# Patient Record
Sex: Female | Born: 1975
Health system: Southern US, Community
[De-identification: ages and names within clinical notes are randomized; demographics above are authoritative.]

## PROBLEM LIST (undated history)

## (undated) DIAGNOSIS — D649 Anemia, unspecified: Secondary | ICD-10-CM

## (undated) DIAGNOSIS — Z8669 Personal history of other diseases of the nervous system and sense organs: Secondary | ICD-10-CM

## (undated) DIAGNOSIS — Z803 Family history of malignant neoplasm of breast: Secondary | ICD-10-CM

## (undated) DIAGNOSIS — G8929 Other chronic pain: Secondary | ICD-10-CM

## (undated) DIAGNOSIS — K5190 Ulcerative colitis, unspecified, without complications: Secondary | ICD-10-CM

## (undated) DIAGNOSIS — E538 Deficiency of other specified B group vitamins: Secondary | ICD-10-CM

## (undated) DIAGNOSIS — R0789 Other chest pain: Secondary | ICD-10-CM

## (undated) DIAGNOSIS — K219 Gastro-esophageal reflux disease without esophagitis: Secondary | ICD-10-CM

## (undated) DIAGNOSIS — Z800 Family history of malignant neoplasm of digestive organs: Secondary | ICD-10-CM

## (undated) DIAGNOSIS — Z8481 Family history of carrier of genetic disease: Secondary | ICD-10-CM

## (undated) DIAGNOSIS — D72819 Decreased white blood cell count, unspecified: Secondary | ICD-10-CM

## (undated) HISTORY — DX: Other chest pain: R07.89

## (undated) HISTORY — PX: KNEE ARTHROSCOPY: SUR90

## (undated) HISTORY — DX: Other chronic pain: G89.29

## (undated) HISTORY — DX: Family history of malignant neoplasm of digestive organs: Z80.0

## (undated) HISTORY — DX: Deficiency of other specified B group vitamins: E53.8

## (undated) HISTORY — DX: Family history of malignant neoplasm of breast: Z80.3

## (undated) HISTORY — DX: Gastro-esophageal reflux disease without esophagitis: K21.9

## (undated) HISTORY — DX: Decreased white blood cell count, unspecified: D72.819

## (undated) HISTORY — PX: MYOMECTOMY: SHX85

## (undated) HISTORY — DX: Family history of carrier of genetic disease: Z84.81

---

## 2001-05-23 ENCOUNTER — Emergency Department (HOSPITAL_COMMUNITY): Admission: EM | Admit: 2001-05-23 | Discharge: 2001-05-23 | Payer: Self-pay | Admitting: Emergency Medicine

## 2001-07-20 ENCOUNTER — Emergency Department (HOSPITAL_COMMUNITY): Admission: EM | Admit: 2001-07-20 | Discharge: 2001-07-20 | Payer: Self-pay | Admitting: Emergency Medicine

## 2001-07-20 ENCOUNTER — Encounter: Payer: Self-pay | Admitting: Emergency Medicine

## 2002-08-24 ENCOUNTER — Emergency Department (HOSPITAL_COMMUNITY): Admission: EM | Admit: 2002-08-24 | Discharge: 2002-08-24 | Payer: Self-pay | Admitting: Emergency Medicine

## 2003-06-07 ENCOUNTER — Emergency Department (HOSPITAL_COMMUNITY): Admission: EM | Admit: 2003-06-07 | Discharge: 2003-06-07 | Payer: Self-pay | Admitting: Emergency Medicine

## 2004-11-09 ENCOUNTER — Emergency Department (HOSPITAL_COMMUNITY): Admission: EM | Admit: 2004-11-09 | Discharge: 2004-11-09 | Payer: Self-pay | Admitting: Emergency Medicine

## 2004-11-22 ENCOUNTER — Emergency Department (HOSPITAL_COMMUNITY): Admission: EM | Admit: 2004-11-22 | Discharge: 2004-11-23 | Payer: Self-pay | Admitting: Emergency Medicine

## 2007-03-10 ENCOUNTER — Emergency Department (HOSPITAL_COMMUNITY): Admission: EM | Admit: 2007-03-10 | Discharge: 2007-03-10 | Payer: Self-pay | Admitting: Emergency Medicine

## 2007-10-18 ENCOUNTER — Emergency Department (HOSPITAL_COMMUNITY): Admission: EM | Admit: 2007-10-18 | Discharge: 2007-10-18 | Payer: Self-pay | Admitting: Family Medicine

## 2008-09-11 ENCOUNTER — Emergency Department (HOSPITAL_COMMUNITY): Admission: EM | Admit: 2008-09-11 | Discharge: 2008-09-11 | Payer: Self-pay | Admitting: Emergency Medicine

## 2008-09-15 ENCOUNTER — Emergency Department (HOSPITAL_COMMUNITY): Admission: EM | Admit: 2008-09-15 | Discharge: 2008-09-15 | Payer: Self-pay | Admitting: Emergency Medicine

## 2008-09-26 ENCOUNTER — Emergency Department (HOSPITAL_COMMUNITY): Admission: EM | Admit: 2008-09-26 | Discharge: 2008-09-26 | Payer: Self-pay | Admitting: Emergency Medicine

## 2008-10-20 ENCOUNTER — Encounter: Admission: RE | Admit: 2008-10-20 | Discharge: 2008-10-20 | Payer: Self-pay | Admitting: Internal Medicine

## 2009-07-04 ENCOUNTER — Encounter: Admission: RE | Admit: 2009-07-04 | Discharge: 2009-07-04 | Payer: Self-pay | Admitting: Internal Medicine

## 2009-07-04 ENCOUNTER — Encounter (INDEPENDENT_AMBULATORY_CARE_PROVIDER_SITE_OTHER): Payer: Self-pay | Admitting: Radiology

## 2009-12-13 ENCOUNTER — Emergency Department (HOSPITAL_COMMUNITY): Admission: EM | Admit: 2009-12-13 | Discharge: 2009-12-14 | Payer: Self-pay | Admitting: Emergency Medicine

## 2010-08-04 ENCOUNTER — Emergency Department (HOSPITAL_COMMUNITY): Admission: EM | Admit: 2010-08-04 | Discharge: 2010-08-04 | Payer: Self-pay | Admitting: Emergency Medicine

## 2010-10-20 ENCOUNTER — Encounter: Payer: Self-pay | Admitting: Internal Medicine

## 2011-11-23 ENCOUNTER — Emergency Department (HOSPITAL_COMMUNITY)
Admission: EM | Admit: 2011-11-23 | Discharge: 2011-11-24 | Disposition: A | Discharge status: Home / Self Care | Payer: BC Managed Care – PPO | Attending: Emergency Medicine | Admitting: Emergency Medicine

## 2011-11-23 ENCOUNTER — Encounter (HOSPITAL_COMMUNITY): Payer: Self-pay | Admitting: *Deleted

## 2011-11-23 DIAGNOSIS — R609 Edema, unspecified: Secondary | ICD-10-CM | POA: Insufficient documentation

## 2011-11-23 DIAGNOSIS — S8012XA Contusion of left lower leg, initial encounter: Secondary | ICD-10-CM

## 2011-11-23 DIAGNOSIS — S8010XA Contusion of unspecified lower leg, initial encounter: Secondary | ICD-10-CM | POA: Insufficient documentation

## 2011-11-23 DIAGNOSIS — IMO0002 Reserved for concepts with insufficient information to code with codable children: Secondary | ICD-10-CM | POA: Insufficient documentation

## 2011-11-23 DIAGNOSIS — M79609 Pain in unspecified limb: Secondary | ICD-10-CM | POA: Insufficient documentation

## 2011-11-23 DIAGNOSIS — W2203XA Walked into furniture, initial encounter: Secondary | ICD-10-CM | POA: Insufficient documentation

## 2011-11-23 NOTE — ED Notes (Signed)
Pt in c/o abrasion to left lower leg, bleeding controlled, states she hit it on the side of a dresser

## 2011-11-24 MED ORDER — HYDROCODONE-ACETAMINOPHEN 5-325 MG PO TABS: 1.0000 | ORAL_TABLET | ORAL | Status: AC | PRN

## 2011-11-24 MED ORDER — TETANUS-DIPHTH-ACELL PERTUSSIS 5-2.5-18.5 LF-MCG/0.5 IM SUSP
0.5000 mL | Freq: Once | INTRAMUSCULAR | Status: AC
  Administered 2011-11-24: 0.5 mL via INTRAMUSCULAR
  Filled 2011-11-24: qty 0.5

## 2011-11-24 NOTE — ED Notes (Signed)
Pt states she hit L lower extremity on dresser at some point tonight, wound noted, bleeding controlled,

## 2011-11-24 NOTE — ED Provider Notes (Signed)
History     CSN: [id number]  Arrival date & time 11/23/11  2140   First MD Initiated Contact with Patient 11/23/11 2353      Chief Complaint  Patient presents with  . Leg Injury    (Consider location/radiation/quality/duration/timing/severity/associated sxs/prior treatment) HPI Comments: Patient here with left lower leg pain s/p hitting the leg on a dresser - hemostatic - pain with ambulation but is able to put pressure on the leg - tetanus not UTD  Patient is a 36 y.o. female presenting with leg pain. The history is provided by the patient. No language interpreter was used.  Leg Pain  The incident occurred 3 to 5 hours ago. The incident occurred at home. The injury mechanism was a direct blow. The pain is present in the left leg. The quality of the pain is described as burning and sharp. The pain is at a severity of 4/10. The pain is moderate. The pain has been constant since onset. Associated symptoms include inability to bear weight. Pertinent negatives include no numbness, no loss of motion, no muscle weakness, no loss of sensation and no tingling. She reports no foreign bodies present. The symptoms are aggravated by nothing. She has tried nothing for the symptoms.    History reviewed. No pertinent past medical history.  History reviewed. No pertinent past surgical history.  History reviewed. No pertinent family history.  History  Substance Use Topics  . Smoking status: Not on file  . Smokeless tobacco: Not on file  . Alcohol Use: Not on file    OB History    Grav Para Term Preterm Abortions TAB SAB Ect Mult Living                  Review of Systems  Constitutional: Negative for fever and chills.  HENT: Negative for neck pain.   Eyes: Negative for pain.  Respiratory: Negative for chest tightness and shortness of breath.   Cardiovascular: Negative for chest pain.  Gastrointestinal: Negative for nausea, vomiting and abdominal pain.  Genitourinary: Negative for  dysuria.  Skin: Positive for wound.  Neurological: Negative for tingling, numbness and headaches.  All other systems reviewed and are negative.    Allergies  Review of patient's allergies indicates no known allergies.  Home Medications  No current outpatient prescriptions on file.  BP 107/74  Pulse 77  Temp(Src) 98.5 F (36.9 C) (Oral)  Resp 16  SpO2 100%  Physical Exam  Nursing note and vitals reviewed. Constitutional: She is oriented to person, place, and time. She appears well-developed and well-nourished. No distress.  HENT:  Head: Normocephalic and atraumatic.  Right Ear: External ear normal.  Left Ear: External ear normal.  Nose: Nose normal.  Mouth/Throat: Oropharynx is clear and moist. No oropharyngeal exudate.  Eyes: Conjunctivae are normal. Pupils are equal, round, and reactive to light. No scleral icterus.  Neck: Normal range of motion. Neck supple.  Cardiovascular: Normal rate and regular rhythm.  Exam reveals no gallop and no friction rub.   No murmur heard. Pulmonary/Chest: Effort normal and breath sounds normal. She exhibits no tenderness.  Abdominal: Soft. Bowel sounds are normal. She exhibits no distension. There is no tenderness.  Musculoskeletal: Normal range of motion. She exhibits edema and tenderness.       Abrasion noted to left lateral shin  Lymphadenopathy:    She has no cervical adenopathy.  Neurological: She is alert and oriented to person, place, and time. No cranial nerve deficit.  Skin: Skin is warm and  dry. No rash noted. No erythema. No pallor.  Psychiatric: She has a normal mood and affect. Her behavior is normal. Judgment and thought content normal.    ED Course  Procedures (including critical care time)  Labs Reviewed - No data to display No results found.   Contusion to left lateral lower leg    MDM  Patient here with abrasion to left lower leg - able to ambulate - no fever, chills, no evidence of fracture - wound dressed,  tetanus updated.        Idalia Needle [place], [place] 11/24/11 0011  Medical screening examination/treatment/procedure(s) were performed by non-physician practitioner and as supervising physician I was immediately available for consultation/collaboration.  Teressa Lower, MD 11/24/11 [phone]

## 2011-11-24 NOTE — Discharge Instructions (Signed)
Contusion A contusion is a deep bruise. Bruises happen when an injury causes bleeding under the skin. Signs of bruising include pain, puffiness (swelling), and discolored skin. The bruise may turn blue, purple, or yellow. HOME CARE   Rest the injured area until the pain and puffiness are better.   Try to limit use of the injured area as much as possible or as told by your doctor.   Put ice on the injured area.   Put ice in a plastic bag.   Place a towel between your skin and the bag.   Leave the ice on for 15 to 20 minutes, 3 to 4 times a day.   Raise (elevate) the injured area above the level of the heart.   Use an elastic bandage to lessen puffiness and motion.   Only take medicine as told by your doctor.   Eat healthy.   See your doctor for a follow-up visit.  GET HELP RIGHT AWAY IF:   There is more redness, puffiness, or pain.   You have a headache, muscle ache, or you feel dizzy and ill.   You have a fever.   The pain is not controlled with medicine.   The bruise is not getting better.   There is yellowish white fluid (pus) coming from the wound.   You lose feeling (numbness) in the injured area.   The bruised area feels cold.   There are new problems.  MAKE SURE YOU:   Understand these instructions.   Will watch your condition.   Will get help right away if you are not doing well or get worse.  Document Released: 03/03/2008 Document Revised: 05/28/2011 Document Reviewed: 03/03/2008 [facility] Patient Information 2012 [place].Hematoma A hematoma is a collection of blood which is located any place outside a blood vessel. It is caused by an injury to blood vessels beneath an injured area with a leaking of blood into that area. As blood accumulates it is known as a hematoma. This collection of blood causes a blue to dark blue coloration of the tissue surrounding it. The injury usually improves in days to weeks. If it is close to the skin it often produces  a yellowish color of the skin and then disappears completely over a brief period of time. These generally get better without any problems. Hematomas rarely need drainage. HOME CARE INSTRUCTIONS   Apply ice to the injured area for 15 to 20 minutes 3 to 4 times per day for the first 1 or 2 days.   Put the ice in a plastic bag and place a towel between the bag of ice and your skin. Discontinue the ice if it causes pain.   If you have a hematoma and a nearby injury to the skin, you may have some bleeding. If the bleeding is more than just a little bit, apply pressure to the area for at least thirty minutes.   If the injury is on an extremity (arm, leg, hand or foot), elevation of that part may help to decrease pain and swelling. Wrapping with an ace or supportive wrap may also be helpful. If the hematoma is on a lower extremity and is painful, crutches may be helpful for a couple days.   If you have been given a tetanus shot because the skin was broken, your arm may get swollen, red and warm to touch at the shot site. This is a normal response to the medicine in the shot. If you did not receive a  tetanus shot today because you did not recall when your last one was given, make sure to check with your caregiver's office and determine if one is needed. Generally for a "dirty" wound, you should receive a tetanus booster if you have not had one in the last five years. If you have a "clean" wound, you should receive a tetanus booster if you have not had one within the last ten years.   Only take over-the-counter or prescription medicines for pain, discomfort, or fever as directed by your caregiver. Do not use aspirin as it may cause bleeding.  SEEK IMMEDIATE MEDICAL CARE IF:   You have pain not controlled with medication.   You develop increasing pain or swelling in the area of injury.   An oral temperature above 102 F (38.9 C) develops.   You develop any problems that seem worse than the problems that  brought you in.  MAKE SURE YOU:   Understand these instructions.   Will watch your condition.   Will get help right away if you are not doing well or get worse.  Document Released: 04/29/2004 Document Revised: 04/10/2011 Document Reviewed: 05/16/2008 [facility] Patient Information 2012 [place].

## 2012-12-06 ENCOUNTER — Emergency Department (HOSPITAL_COMMUNITY)
Admission: EM | Admit: 2012-12-06 | Discharge: 2012-12-06 | Disposition: A | Discharge status: Home / Self Care | Payer: Self-pay | Attending: Emergency Medicine | Admitting: Emergency Medicine

## 2012-12-06 ENCOUNTER — Encounter (HOSPITAL_COMMUNITY): Payer: Self-pay | Admitting: Emergency Medicine

## 2012-12-06 DIAGNOSIS — F17200 Nicotine dependence, unspecified, uncomplicated: Secondary | ICD-10-CM | POA: Insufficient documentation

## 2012-12-06 DIAGNOSIS — Z862 Personal history of diseases of the blood and blood-forming organs and certain disorders involving the immune mechanism: Secondary | ICD-10-CM | POA: Insufficient documentation

## 2012-12-06 DIAGNOSIS — R112 Nausea with vomiting, unspecified: Secondary | ICD-10-CM | POA: Insufficient documentation

## 2012-12-06 DIAGNOSIS — Z3202 Encounter for pregnancy test, result negative: Secondary | ICD-10-CM | POA: Insufficient documentation

## 2012-12-06 HISTORY — DX: Anemia, unspecified: D64.9

## 2012-12-06 LAB — URINALYSIS, ROUTINE W REFLEX MICROSCOPIC
Glucose, UA: NEGATIVE mg/dL
Ketones, ur: NEGATIVE mg/dL
Protein, ur: NEGATIVE mg/dL
Urobilinogen, UA: 1 mg/dL (ref 0.0–1.0)

## 2012-12-06 LAB — URINE MICROSCOPIC-ADD ON

## 2012-12-06 LAB — POCT PREGNANCY, URINE: Preg Test, Ur: NEGATIVE

## 2012-12-06 MED ORDER — SODIUM CHLORIDE 0.9 % IV BOLUS (SEPSIS)
1000.0000 mL | Freq: Once | INTRAVENOUS | Status: AC
  Administered 2012-12-06: 1000 mL via INTRAVENOUS

## 2012-12-06 MED ORDER — PROMETHAZINE HCL 25 MG/ML IJ SOLN
25.0000 mg | Freq: Once | INTRAMUSCULAR | Status: DC
  Filled 2012-12-06: qty 1

## 2012-12-06 MED ORDER — PROMETHAZINE HCL 25 MG PO TABS: 25.0000 mg | ORAL_TABLET | Freq: Four times a day (QID) | ORAL | Status: DC | PRN

## 2012-12-06 MED ORDER — ONDANSETRON 4 MG PO TBDP
4.0000 mg | ORAL_TABLET | Freq: Once | ORAL | Status: AC
  Administered 2012-12-06: 4 mg via ORAL
  Filled 2012-12-06: qty 1

## 2012-12-06 NOTE — ED Provider Notes (Signed)
History     CSN: [id number]  Arrival date & time 12/06/12  0040   First MD Initiated Contact with Patient 12/06/12 [phone]      Chief Complaint  Patient presents with  . Nausea  . Emesis    (Consider location/radiation/quality/duration/timing/severity/associated sxs/prior treatment) HPI Comments: Patient is a 37 year old female who presents with a 3 day history of nausea and vomiting. Patient reports gradual onset and progressive worsening of symptoms since onset. Patient has tried dramamine which provided some relief. Laying down makes her symptoms worse. No alleviating factors. No associated symptoms. No sick contacts.   Patient is a 37 y.o. female presenting with vomiting.  Emesis   Past Medical History  Diagnosis Date  . Anemia     History reviewed. No pertinent past surgical history.  Family History  Problem Relation Age of Onset  . Cancer Other   . Hypertension Other     History  Substance Use Topics  . Smoking status: Current Every Day Smoker    Types: Cigarettes  . Smokeless tobacco: Not on file  . Alcohol Use: Yes     Comment: occ    OB History   Grav Para Term Preterm Abortions TAB SAB Ect Mult Living                  Review of Systems  Gastrointestinal: Positive for nausea and vomiting.  All other systems reviewed and are negative.    Allergies  Percocet  Home Medications   Current Outpatient Rx  Name  Route  Sig  Dispense  Refill  . anti-nausea (EMETROL) solution   Oral   Take 15 mLs by mouth every 15 (fifteen) minutes as needed for nausea.         Marland Kitchen dimenhyDRINATE (DRAMAMINE) 50 MG tablet   Oral   Take 50 mg by mouth every 8 (eight) hours as needed. nausea           BP 107/62  Pulse 63  Temp(Src) 98.2 F (36.8 C) (Oral)  Resp 18  SpO2 98%  LMP 11/21/2012  Physical Exam  Nursing note and vitals reviewed. Constitutional: She is oriented to person, place, and time. She appears well-developed and well-nourished. No distress.   HENT:  Head: Normocephalic and atraumatic.  Eyes: Conjunctivae and EOM are normal. No scleral icterus.  Neck: Normal range of motion. Neck supple.  Cardiovascular: Normal rate and regular rhythm.  Exam reveals no gallop and no friction rub.   No murmur heard. Pulmonary/Chest: Effort normal and breath sounds normal. She has no wheezes. She has no rales. She exhibits no tenderness.  Abdominal: Soft. She exhibits no distension. There is no tenderness. There is no rebound and no guarding.  Musculoskeletal: Normal range of motion.  Neurological: She is alert and oriented to person, place, and time. Coordination normal.  Speech is goal-oriented. Moves limbs without ataxia.   Skin: Skin is warm and dry.  Psychiatric: She has a normal mood and affect. Her behavior is normal.    ED Course  Procedures (including critical care time)  Labs Reviewed  URINALYSIS, ROUTINE W REFLEX MICROSCOPIC - Abnormal; Notable for the following:    APPearance HAZY (*)    Bilirubin Urine SMALL (*)    Leukocytes, UA SMALL (*)    All other components within normal limits  URINE MICROSCOPIC-ADD ON - Abnormal; Notable for the following:    Squamous Epithelial / LPF FEW (*)    All other components within normal limits  POCT  PREGNANCY, URINE   No results found.   1. Nausea and vomiting       MDM  4:08 AM Patient feeling better after ODT zofran. Urinalysis unremarkable for infection. Urine pregnancy negative. Vitals stable and patient afebrile. Patient likely has a viral illness. I will discharge her with zofran. Patient instructed to return with worsening or concerning symptoms.         Alvina Chou, PA-C 12/06/12 [phone]

## 2012-12-06 NOTE — ED Notes (Signed)
Pt states she has had nausea since Friday night  Pt states she has been using dramamine for the nausea  Pt states she vomited tonight    Pt states she only feels like that when she lays down  Denies diarrhea

## 2012-12-07 NOTE — ED Provider Notes (Signed)
Medical screening examination/treatment/procedure(s) were performed by non-physician practitioner and as supervising physician I was immediately available for consultation/collaboration.  Rhunette Croft, M.D.      Rhunette Croft, MD 12/07/12 0745

## 2012-12-17 ENCOUNTER — Emergency Department (HOSPITAL_COMMUNITY)
Admission: EM | Admit: 2012-12-17 | Discharge: 2012-12-17 | Disposition: A | Discharge status: Home / Self Care | Payer: BC Managed Care – PPO | Attending: Emergency Medicine | Admitting: Emergency Medicine

## 2012-12-17 ENCOUNTER — Encounter (HOSPITAL_COMMUNITY): Payer: Self-pay | Admitting: Emergency Medicine

## 2012-12-17 DIAGNOSIS — F17200 Nicotine dependence, unspecified, uncomplicated: Secondary | ICD-10-CM | POA: Insufficient documentation

## 2012-12-17 DIAGNOSIS — H9209 Otalgia, unspecified ear: Secondary | ICD-10-CM | POA: Insufficient documentation

## 2012-12-17 DIAGNOSIS — Z862 Personal history of diseases of the blood and blood-forming organs and certain disorders involving the immune mechanism: Secondary | ICD-10-CM | POA: Insufficient documentation

## 2012-12-17 DIAGNOSIS — R112 Nausea with vomiting, unspecified: Secondary | ICD-10-CM | POA: Insufficient documentation

## 2012-12-17 DIAGNOSIS — H6120 Impacted cerumen, unspecified ear: Secondary | ICD-10-CM | POA: Insufficient documentation

## 2012-12-17 DIAGNOSIS — Z3202 Encounter for pregnancy test, result negative: Secondary | ICD-10-CM | POA: Insufficient documentation

## 2012-12-17 DIAGNOSIS — R42 Dizziness and giddiness: Secondary | ICD-10-CM | POA: Insufficient documentation

## 2012-12-17 DIAGNOSIS — H9202 Otalgia, left ear: Secondary | ICD-10-CM

## 2012-12-17 LAB — URINALYSIS, ROUTINE W REFLEX MICROSCOPIC
Glucose, UA: NEGATIVE mg/dL
Ketones, ur: NEGATIVE mg/dL
Nitrite: NEGATIVE
Specific Gravity, Urine: 1.033 — ABNORMAL HIGH (ref 1.005–1.030)
Urobilinogen, UA: 1 mg/dL (ref 0.0–1.0)
pH: 5.5 (ref 5.0–8.0)

## 2012-12-17 LAB — POCT I-STAT, CHEM 8
Chloride: 104 mEq/L (ref 96–112)
Glucose, Bld: 100 mg/dL — ABNORMAL HIGH (ref 70–99)
Hemoglobin: 14.3 g/dL (ref 12.0–15.0)
Potassium: 2.9 mEq/L — ABNORMAL LOW (ref 3.5–5.1)
Sodium: 142 mEq/L (ref 135–145)
TCO2: 26 mmol/L (ref 0–100)

## 2012-12-17 MED ORDER — SODIUM CHLORIDE 0.9 % IV SOLN
1000.0000 mL | INTRAVENOUS | Status: DC
  Administered 2012-12-17: 1000 mL via INTRAVENOUS

## 2012-12-17 MED ORDER — DOCUSATE SODIUM 50 MG/5ML PO LIQD
50.0000 mg | Freq: Once | ORAL | Status: AC
  Administered 2012-12-17: 50 mg via ORAL
  Filled 2012-12-17: qty 10

## 2012-12-17 MED ORDER — SODIUM CHLORIDE 0.9 % IV SOLN
1000.0000 mL | Freq: Once | INTRAVENOUS | Status: AC
  Administered 2012-12-17: 1000 mL via INTRAVENOUS

## 2012-12-17 MED ORDER — AMOXICILLIN-POT CLAVULANATE 875-125 MG PO TABS: 1.0000 | ORAL_TABLET | Freq: Two times a day (BID) | ORAL | Status: DC

## 2012-12-17 MED ORDER — ONDANSETRON HCL 4 MG/2ML IJ SOLN
4.0000 mg | Freq: Once | INTRAMUSCULAR | Status: AC
  Administered 2012-12-17: 4 mg via INTRAVENOUS
  Filled 2012-12-17: qty 2

## 2012-12-17 MED ORDER — ONDANSETRON 4 MG PO TBDP: 4.0000 mg | ORAL_TABLET | Freq: Three times a day (TID) | ORAL | Status: DC | PRN

## 2012-12-17 NOTE — ED Provider Notes (Signed)
History     CSN: [id number]  Arrival date & time 12/17/12  1427   First MD Initiated Contact with Patient 12/17/12 1446      Chief Complaint  Patient presents with  . Nausea    (Consider location/radiation/quality/duration/timing/severity/associated sxs/prior treatment) HPI Comments: Pt presents to the ED for nausea and vomiting increasing over the past few days.  She was seen in the ED approx 2 weeks ago for similar sx.  States they improved initially but now are worsening again.  Now she has intermittent light-headedness without syncopal episodes or LOC.  Also complaining of left ear pain.  Denies chest pain, SOB, abdominal pain, diarrhea, fever, sinus congestion, or cough.  The history is provided by the patient.    Past Medical History  Diagnosis Date  . Anemia     History reviewed. No pertinent past surgical history.  Family History  Problem Relation Age of Onset  . Cancer Other   . Hypertension Other     History  Substance Use Topics  . Smoking status: Current Every Day Smoker    Types: Cigarettes  . Smokeless tobacco: Not on file  . Alcohol Use: Yes     Comment: occ    OB History   Grav Para Term Preterm Abortions TAB SAB Ect Mult Living                  Review of Systems  HENT: Positive for ear pain.   Gastrointestinal: Positive for nausea and vomiting.  Neurological: Positive for light-headedness.  All other systems reviewed and are negative.    Allergies  Percocet  Home Medications   Current Outpatient Rx  Name  Route  Sig  Dispense  Refill  . acetaminophen (TYLENOL) 500 MG tablet   Oral   Take 1,000 mg by mouth every 6 (six) hours as needed for pain.         Marland Kitchen anti-nausea (EMETROL) solution   Oral   Take 15 mLs by mouth every 15 (fifteen) minutes as needed for nausea.         Marland Kitchen dimenhyDRINATE (DRAMAMINE) 50 MG tablet   Oral   Take 50 mg by mouth every 8 (eight) hours as needed. nausea         . Multiple Vitamin (MULTIVITAMIN  WITH MINERALS) TABS   Oral   Take 1 tablet by mouth daily.         . promethazine (PHENERGAN) 25 MG tablet   Oral   Take 1 tablet (25 mg total) by mouth every 6 (six) hours as needed for nausea.   12 tablet   0     BP 113/78  Pulse 91  Temp(Src) 99.2 F (37.3 C) (Oral)  Resp 16  Ht 5' 9"  (1.753 m)  Wt 130 lb (58.968 kg)  BMI 19.19 kg/m2  SpO2 99%  LMP 11/22/2012  Physical Exam  Nursing note and vitals reviewed. Constitutional: She is oriented to person, place, and time. She appears well-developed and well-nourished.  HENT:  Head: Normocephalic and atraumatic.  Right Ear: Tympanic membrane and ear canal normal.  Left Ear: There is tenderness. There is mastoid tenderness.  Nose: Nose normal.  Mouth/Throat: Uvula is midline and oropharynx is clear and moist. No posterior oropharyngeal edema, posterior oropharyngeal erythema or tonsillar abscesses.  Unable to visualize L TM due to impacted cerumen, mild swelling noted around mastoid  Eyes: Conjunctivae and EOM are normal. Pupils are equal, round, and reactive to light.  Neck: Normal range of  motion and full passive range of motion without pain. No spinous process tenderness and no muscular tenderness present. No rigidity.  Cardiovascular: Normal rate, regular rhythm and normal heart sounds.   Pulmonary/Chest: Effort normal and breath sounds normal. She has no decreased breath sounds.  Abdominal: Soft. Bowel sounds are normal. There is no tenderness. There is no CVA tenderness, no tenderness at McBurney's point and negative Murphy's sign.  Musculoskeletal: Normal range of motion. She exhibits no edema.  Neurological: She is alert and oriented to person, place, and time. She has normal strength. No cranial nerve deficit or sensory deficit.  Skin: Skin is warm and dry.  Psychiatric: She has a normal mood and affect. Her speech is normal.    ED Course  Procedures (including critical care time)  Labs Reviewed  POCT I-STAT,  CHEM 8 - Abnormal; Notable for the following:    Potassium 2.9 (*)    BUN <3 (*)    Glucose, Bld 100 (*)    All other components within normal limits  URINALYSIS, ROUTINE W REFLEX MICROSCOPIC  PREGNANCY, URINE   No results found.   1. Otalgia of left ear   2. Nausea & vomiting       MDM   Good resolution of nausea in the ED with IVF and zofran. u-preg negative. Impacted cerumen of left EAC prevented full visualization of the TM.  With mastoid tenderness and mild swelling noted, i am concerned for early mastoiditis.  Will tx with augmentin x 10days.  Pt given Rx zofran PRN nausea.  Return precautions advised.        Larene Pickett, PA-C 12/19/12 [phone]

## 2012-12-17 NOTE — ED Notes (Signed)
Ear wax removal attempt unsuccessful. Will try again 10 min.

## 2012-12-17 NOTE — ED Notes (Signed)
Patient was recently seen for the same.  Patient still experiencing nausea and vomiting that worsens with eating.  Patient denies fevers and abdominal pain.

## 2012-12-17 NOTE — ED Notes (Signed)
Pt aware of the need for a urine sample.

## 2012-12-19 LAB — URINE CULTURE

## 2012-12-21 NOTE — ED Provider Notes (Signed)
Medical screening examination/treatment/procedure(s) were conducted as a shared visit with non-physician practitioner(s) and myself.  I personally evaluated the patient during the encounter.  Pt is non toxic.  Will rx for early mastoiditis  Nat Christen, MD 12/21/12 1041

## 2012-12-30 ENCOUNTER — Encounter (HOSPITAL_COMMUNITY): Payer: Self-pay | Admitting: Emergency Medicine

## 2012-12-30 ENCOUNTER — Emergency Department (HOSPITAL_COMMUNITY)
Admission: EM | Admit: 2012-12-30 | Discharge: 2012-12-30 | Disposition: A | Discharge status: Home / Self Care | Payer: BC Managed Care – PPO | Attending: Emergency Medicine | Admitting: Emergency Medicine

## 2012-12-30 DIAGNOSIS — Z862 Personal history of diseases of the blood and blood-forming organs and certain disorders involving the immune mechanism: Secondary | ICD-10-CM | POA: Insufficient documentation

## 2012-12-30 DIAGNOSIS — Z3202 Encounter for pregnancy test, result negative: Secondary | ICD-10-CM | POA: Insufficient documentation

## 2012-12-30 DIAGNOSIS — F17200 Nicotine dependence, unspecified, uncomplicated: Secondary | ICD-10-CM | POA: Insufficient documentation

## 2012-12-30 DIAGNOSIS — Z79899 Other long term (current) drug therapy: Secondary | ICD-10-CM | POA: Insufficient documentation

## 2012-12-30 DIAGNOSIS — R110 Nausea: Secondary | ICD-10-CM | POA: Insufficient documentation

## 2012-12-30 DIAGNOSIS — R630 Anorexia: Secondary | ICD-10-CM | POA: Insufficient documentation

## 2012-12-30 LAB — CBC WITH DIFFERENTIAL/PLATELET
Hemoglobin: 11.7 g/dL — ABNORMAL LOW (ref 12.0–15.0)
Lymphocytes Relative: 39 % (ref 12–46)
Lymphs Abs: 1.4 10*3/uL (ref 0.7–4.0)
MCV: 89.5 fL (ref 78.0–100.0)
Neutrophils Relative %: 46 % (ref 43–77)
Platelets: 173 10*3/uL (ref 150–400)
RBC: 4.08 MIL/uL (ref 3.87–5.11)
WBC: 3.5 10*3/uL — ABNORMAL LOW (ref 4.0–10.5)

## 2012-12-30 LAB — URINALYSIS, ROUTINE W REFLEX MICROSCOPIC
Hgb urine dipstick: NEGATIVE
Nitrite: NEGATIVE
Specific Gravity, Urine: 1.019 (ref 1.005–1.030)
Urobilinogen, UA: 1 mg/dL (ref 0.0–1.0)
pH: 7.5 (ref 5.0–8.0)

## 2012-12-30 LAB — COMPREHENSIVE METABOLIC PANEL
ALT: 12 U/L (ref 0–35)
AST: 15 U/L (ref 0–37)
Albumin: 4 g/dL (ref 3.5–5.2)
Calcium: 9 mg/dL (ref 8.4–10.5)
GFR calc Af Amer: >90 mL/min (ref >90)
Glucose, Bld: 87 mg/dL (ref 70–99)
Potassium: 3.7 mEq/L (ref 3.5–5.1)
Sodium: 138 mEq/L (ref 135–145)
Total Protein: 6.8 g/dL (ref 6.0–8.3)

## 2012-12-30 LAB — PREGNANCY, URINE: Preg Test, Ur: NEGATIVE

## 2012-12-30 MED ORDER — ONDANSETRON 4 MG PO TBDP: 4.0000 mg | ORAL_TABLET | Freq: Three times a day (TID) | ORAL | Status: DC | PRN

## 2012-12-30 MED ORDER — OMEPRAZOLE 40 MG PO CPDR: 40.0000 mg | DELAYED_RELEASE_CAPSULE | Freq: Every day | ORAL | Status: DC

## 2012-12-30 NOTE — Progress Notes (Signed)
[facility] ED CM noted pt with coverage but no pcp listed Spoke with pt who confirms no pcp but aware of how to obtain one with insurance coverage customer service number or web site

## 2012-12-30 NOTE — ED Notes (Signed)
Pt c/o epigastric pain that has been going on for over a month. Pt has been seen here for same symptoms twice since they started. Pt states that in 2003 she was told that she had small gastric ulcers. Pt states she took a zofran prior to arrival.

## 2012-12-30 NOTE — ED Provider Notes (Signed)
History    CSN: [id number] Arrival date & time 12/30/12  18 First MD Initiated Contact with Patient 12/30/12 1324     Chief Complaint  Patient presents with  . Abdominal Pain  . Flank Pain    Left   Patient is a 37 y.o. female presenting with abdominal pain and flank pain. The history is provided by the patient.  Abdominal Pain Pain location:  L flank Pain quality: sharp   Pain radiates to:  Does not radiate Pain severity:  Moderate Onset quality:  Sudden Duration: intermittently over the last month, lasts for seconds at a time. Timing:  Intermittent Chronicity:  Recurrent Context: eating   Relieved by:  Nothing Worsened by:  Eating (Whenever she eats she tends to feel gassy and has nausea.) Associated symptoms: anorexia and nausea   Associated symptoms: no chest pain, no cough, no dysuria, no fever and no vomiting   Flank Pain Associated symptoms include abdominal pain. Pertinent negatives include no chest pain.  Pt has been in the ED twice before.  She came back to the ED today because the symptoms have persisted.  She has not been able to make an apointment with a PCP or GI doctor.  Past Medical History  Diagnosis Date  . Anemia     History reviewed. No pertinent past surgical history.  Family History  Problem Relation Age of Onset  . Cancer Other   . Hypertension Other     History  Substance Use Topics  . Smoking status: Current Every Day Smoker    Types: Cigarettes  . Smokeless tobacco: Not on file  . Alcohol Use: Yes     Comment: occ    OB History   Grav Para Term Preterm Abortions TAB SAB Ect Mult Living                  Review of Systems  Constitutional: Negative for fever.  Respiratory: Negative for cough.   Cardiovascular: Negative for chest pain.  Gastrointestinal: Positive for nausea, abdominal pain and anorexia. Negative for vomiting.  Genitourinary: Positive for flank pain. Negative for dysuria.  All other systems reviewed and are  negative.    Allergies  Percocet  Home Medications   Current Outpatient Rx  Name  Route  Sig  Dispense  Refill  . acetaminophen (TYLENOL) 500 MG tablet   Oral   Take 1,000 mg by mouth every 6 (six) hours as needed for pain.         Marland Kitchen anti-nausea (EMETROL) solution   Oral   Take 15 mLs by mouth every 15 (fifteen) minutes as needed for nausea.         Marland Kitchen dimenhyDRINATE (DRAMAMINE) 50 MG tablet   Oral   Take 50 mg by mouth every 8 (eight) hours as needed. nausea         . Multiple Vitamin (MULTIVITAMIN WITH MINERALS) TABS   Oral   Take 1 tablet by mouth daily.         Marland Kitchen amoxicillin-clavulanate (AUGMENTIN) 875-125 MG per tablet   Oral   Take 1 tablet by mouth every 12 (twelve) hours.   20 tablet   0   . omeprazole (PRILOSEC) 40 MG capsule   Oral   Take 1 capsule (40 mg total) by mouth daily.   30 capsule   1   . ondansetron (ZOFRAN ODT) 4 MG disintegrating tablet   Oral   Take 1 tablet (4 mg total) by mouth every 8 (eight) hours as  needed for nausea.   15 tablet   0     BP 114/61  Pulse 62  Temp(Src) 98.1 F (36.7 C) (Oral)  Resp 18  SpO2 100%  LMP 12/20/2012  Physical Exam  Nursing note and vitals reviewed. Constitutional: She appears well-developed and well-nourished. No distress.  HENT:  Head: Normocephalic and atraumatic.  Right Ear: External ear normal.  Left Ear: External ear normal.  Eyes: Conjunctivae are normal. Right eye exhibits no discharge. Left eye exhibits no discharge. No scleral icterus.  Neck: Neck supple. No tracheal deviation present.  Cardiovascular: Normal rate, regular rhythm and intact distal pulses.   Pulmonary/Chest: Effort normal and breath sounds normal. No stridor. No respiratory distress. She has no wheezes. She has no rales.  Abdominal: Soft. Bowel sounds are normal. She exhibits no distension. There is no tenderness. There is no rebound and no guarding.  Musculoskeletal: She exhibits no edema and no tenderness.   Neurological: She is alert. She has normal strength. No sensory deficit. Cranial nerve deficit:  no gross defecits noted. She exhibits normal muscle tone. She displays no seizure activity. Coordination normal.  Skin: Skin is warm and dry. No rash noted.  Psychiatric: She has a normal mood and affect.    ED Course  Procedures (including critical care time)  Labs Reviewed  COMPREHENSIVE METABOLIC PANEL - Abnormal; Notable for the following:    BUN 5 (*)    All other components within normal limits  CBC WITH DIFFERENTIAL - Abnormal; Notable for the following:    WBC 3.5 (*)    Hemoglobin 11.7 (*)    Neutro Abs 1.6 (*)    All other components within normal limits  LIPASE, BLOOD  URINALYSIS, ROUTINE W REFLEX MICROSCOPIC  PREGNANCY, URINE   No results found.   1. Nausea       MDM   The patient's abdominal exam is benign. Doubt any acute emergency medical condition.  She has history of ulcers and it is possible that is the cause of her symptoms once again. I had a long discussion with patient regarding outpatient followup I explained to her that although I do not see any signs of any emergency condition it does not mean that there is not a medical condition causing her symptoms. She should follow up with her primary doctor or gastroenterologist. I will give her a prescription for antacids and anti-medics       Kathalene Frames, MD 12/30/12 1551

## 2013-01-07 ENCOUNTER — Emergency Department (HOSPITAL_COMMUNITY): Payer: BC Managed Care – PPO

## 2013-01-07 ENCOUNTER — Encounter (HOSPITAL_COMMUNITY): Payer: Self-pay | Admitting: Cardiology

## 2013-01-07 ENCOUNTER — Emergency Department (HOSPITAL_COMMUNITY)
Admission: EM | Admit: 2013-01-07 | Discharge: 2013-01-07 | Disposition: A | Discharge status: Home / Self Care | Payer: BC Managed Care – PPO | Attending: Emergency Medicine | Admitting: Emergency Medicine

## 2013-01-07 DIAGNOSIS — F17200 Nicotine dependence, unspecified, uncomplicated: Secondary | ICD-10-CM | POA: Insufficient documentation

## 2013-01-07 DIAGNOSIS — Z862 Personal history of diseases of the blood and blood-forming organs and certain disorders involving the immune mechanism: Secondary | ICD-10-CM | POA: Insufficient documentation

## 2013-01-07 DIAGNOSIS — R002 Palpitations: Secondary | ICD-10-CM | POA: Insufficient documentation

## 2013-01-07 DIAGNOSIS — R079 Chest pain, unspecified: Secondary | ICD-10-CM

## 2013-01-07 DIAGNOSIS — R0602 Shortness of breath: Secondary | ICD-10-CM | POA: Insufficient documentation

## 2013-01-07 DIAGNOSIS — Z79899 Other long term (current) drug therapy: Secondary | ICD-10-CM | POA: Insufficient documentation

## 2013-01-07 LAB — BASIC METABOLIC PANEL
BUN: 5 mg/dL — ABNORMAL LOW (ref 6–23)
CO2: 25 mEq/L (ref 19–32)
Chloride: 105 mEq/L (ref 96–112)
Glucose, Bld: 106 mg/dL — ABNORMAL HIGH (ref 70–99)
Potassium: 3.3 mEq/L — ABNORMAL LOW (ref 3.5–5.1)

## 2013-01-07 LAB — CBC
HCT: 33.9 % — ABNORMAL LOW (ref 36.0–46.0)
Hemoglobin: 11.6 g/dL — ABNORMAL LOW (ref 12.0–15.0)
WBC: 4.6 10*3/uL (ref 4.0–10.5)

## 2013-01-07 NOTE — ED Provider Notes (Signed)
History     CSN: [id number]  Arrival date & time 01/07/13  1216   First MD Initiated Contact with Patient 01/07/13 1406      Chief Complaint  Patient presents with  . Chest Pain    (Consider location/radiation/quality/duration/timing/severity/associated sxs/prior treatment) HPI  37 year old female with history of anemia presents complaining of chest discomfort. Patient reports for the past 3 days she has had intermittent bouts of shortness of breath especially after exertion, and intermittent sharp pain radiated across the upper chest, and occasional heart palpitation. Symptom has been waxing and waning. She is unsure if this is anxiety, but denies SI/HI, or hallucination. She denies fever, chills, headache, vision changes, nausea, vomiting, diarrhea, cough, hemoptysis, abdominal pain, back pain, new numbness or weakness, or rash. No specific treatment tried. Is a smoker and smoked about 5 cigarettes a day. No history of asthma. Patient denies history of PE, DVT. She is not on birth control pill, no recent surgery, prolonged bed rest, long travel, having leg swelling or calf tenderness.  Patient does not have a primary care Dr.  Past Medical History  Diagnosis Date  . Anemia     History reviewed. No pertinent past surgical history.  Family History  Problem Relation Age of Onset  . Cancer Other   . Hypertension Other     History  Substance Use Topics  . Smoking status: Current Every Day Smoker    Types: Cigarettes  . Smokeless tobacco: Not on file  . Alcohol Use: Yes     Comment: occ    OB History   Grav Para Term Preterm Abortions TAB SAB Ect Mult Living                  Review of Systems  Constitutional:       10 Systems reviewed and all are negative for acute change except as noted in the HPI.     Allergies  Percocet  Home Medications   Current Outpatient Rx  Name  Route  Sig  Dispense  Refill  . acetaminophen (TYLENOL) 500 MG tablet   Oral   Take 1,000  mg by mouth every 6 (six) hours as needed for pain.         Marland Kitchen amoxicillin-clavulanate (AUGMENTIN) 875-125 MG per tablet   Oral   Take 1 tablet by mouth every 12 (twelve) hours.   20 tablet   0   . anti-nausea (EMETROL) solution   Oral   Take 15 mLs by mouth every 15 (fifteen) minutes as needed for nausea.         Marland Kitchen dimenhyDRINATE (DRAMAMINE) 50 MG tablet   Oral   Take 50 mg by mouth every 8 (eight) hours as needed. nausea         . Multiple Vitamin (MULTIVITAMIN WITH MINERALS) TABS   Oral   Take 1 tablet by mouth daily.         Marland Kitchen omeprazole (PRILOSEC) 40 MG capsule   Oral   Take 1 capsule (40 mg total) by mouth daily.   30 capsule   1   . ondansetron (ZOFRAN ODT) 4 MG disintegrating tablet   Oral   Take 1 tablet (4 mg total) by mouth every 8 (eight) hours as needed for nausea.   15 tablet   0     BP 118/74  Pulse 66  Temp(Src) 98 F (36.7 C) (Oral)  Resp 16  SpO2 100%  LMP 12/20/2012  Physical Exam  Nursing note and vitals  reviewed. Constitutional: She is oriented to person, place, and time. She appears well-developed and well-nourished. No distress.  Awake, alert, nontoxic appearance  HENT:  Head: Atraumatic.  Eyes: Conjunctivae are normal. Right eye exhibits no discharge. Left eye exhibits no discharge.  Neck: Neck supple.  Cardiovascular: Normal rate and regular rhythm.  Exam reveals no gallop and no friction rub.   No murmur heard. Pulmonary/Chest: Effort normal. No respiratory distress. She exhibits tenderness (Mild upper chest wall discomfort on palpation without overlying skin changes, no crepitus, no emphysema. No rash.).  Abdominal: Soft. There is no tenderness. There is no rebound.  Musculoskeletal: She exhibits no edema and no tenderness.  ROM appears intact, no obvious focal weakness  BLE: no palpable cords, erythema, negative homan's sign.  Pedal pulses palpable.  Neurological: She is alert and oriented to person, place, and time.   Mental status and motor strength appears intact  Skin: No rash noted.  Psychiatric: She has a normal mood and affect.    ED Course  Procedures (including critical care time)  2:20 PM  Date: 01/07/2013  Rate: 75  Rhythm: normal sinus rhythm with sinus arrhythmia  QRS Axis: normal  Intervals: normal  ST/T Wave abnormalities: normal  Conduction Disutrbances:none  Narrative Interpretation:   Old EKG Reviewed: none available  2:38 PM Patient presents complaining of several vague symptoms including sharp chest pain. Pain is reproducible on palpation. Although reports that exertion, however she has no significant risk factor concerning for PE or DVT. PERC negative.  TIMI 0.  Her CXR, ECG, troponin and labs are unremarkable.  Does have elevated platelet of 547, nonspecific, likely from smoking hx.  Pt appears nontoxic.  Since her sxs ongoing x 3-4 days and negative troponin, no further cardiac work up required in this ER setting. Pt also has been seen in ER several times for the past 2 months for complaints of N/V.  No significant finding during those visits.  Her UA and preg test last week was unremarkable.   I believe pt will be best work up in an outpt setting.   Resources given, pt voice understanding and agrees to f/u.  Return precaution discussed.  Smoking cessation discussed.  Pt maintain normal O2 with ambulation.    Labs Reviewed  CBC - Abnormal; Notable for the following:    Hemoglobin 11.6 (*)    HCT 33.9 (*)    Platelets 547 (*)    All other components within normal limits  BASIC METABOLIC PANEL - Abnormal; Notable for the following:    Potassium 3.3 (*)    Glucose, Bld 106 (*)    BUN 5 (*)    All other components within normal limits  POCT I-STAT TROPONIN I   Dg Chest 2 View  01/07/2013  *RADIOLOGY REPORT*  Clinical Data: Chest pain  CHEST - 2 VIEW  Comparison: 12/13/2009  Findings: Cardiomediastinal silhouette is stable.  No acute infiltrate or pleural effusion.  No  pulmonary edema.  Bony thorax is unremarkable.  IMPRESSION: No active disease.  No significant change.   Original Report Authenticated By: Lahoma Crocker, M.D.      1. Chest pain       MDM  BP 118/74  Pulse 66  Temp(Src) 98 F (36.7 C) (Oral)  Resp 16  SpO2 100%  LMP 12/20/2012  I have reviewed nursing notes and vital signs. I personally reviewed the imaging tests through PACS system  I reviewed available ER/hospitalization records thought the EMR  Domenic Moras, PA-C 01/07/13 1513

## 2013-01-07 NOTE — ED Notes (Signed)
Pt reports over the past couple of days she has had a tightness across her chest and some SOB with activity. Reports some nausea but no vomiting. States she has been taking Prilosec for her stomach recently.

## 2013-01-07 NOTE — ED Provider Notes (Signed)
Medical screening examination/treatment/procedure(s) were performed by non-physician practitioner and as supervising physician I was immediately available for consultation/collaboration.   Charles B. Karle Starch, MD 01/07/13 1517

## 2013-02-10 ENCOUNTER — Encounter (HOSPITAL_COMMUNITY): Payer: Self-pay

## 2013-02-10 ENCOUNTER — Emergency Department (HOSPITAL_COMMUNITY)
Admission: EM | Admit: 2013-02-10 | Discharge: 2013-02-10 | Disposition: A | Discharge status: Home / Self Care | Payer: BC Managed Care – PPO | Attending: Emergency Medicine | Admitting: Emergency Medicine

## 2013-02-10 ENCOUNTER — Emergency Department (HOSPITAL_COMMUNITY): Payer: BC Managed Care – PPO

## 2013-02-10 DIAGNOSIS — Z862 Personal history of diseases of the blood and blood-forming organs and certain disorders involving the immune mechanism: Secondary | ICD-10-CM | POA: Insufficient documentation

## 2013-02-10 DIAGNOSIS — R071 Chest pain on breathing: Secondary | ICD-10-CM | POA: Insufficient documentation

## 2013-02-10 DIAGNOSIS — R0789 Other chest pain: Secondary | ICD-10-CM

## 2013-02-10 DIAGNOSIS — F17200 Nicotine dependence, unspecified, uncomplicated: Secondary | ICD-10-CM | POA: Insufficient documentation

## 2013-02-10 DIAGNOSIS — R0602 Shortness of breath: Secondary | ICD-10-CM | POA: Insufficient documentation

## 2013-02-10 DIAGNOSIS — R51 Headache: Secondary | ICD-10-CM | POA: Insufficient documentation

## 2013-02-10 LAB — POCT I-STAT TROPONIN I: Troponin i, poc: 0 ng/mL (ref 0.00–0.08)

## 2013-02-10 NOTE — ED Notes (Signed)
Pt presents today with complaints of shortness of breath and a headache that started yesterday. Pt also said that her left arm is going numb. Able to move her left arm without difficulty. Pt also complaining of a pain across her chest that has been off and on since yesterday. No acute distress at this time. Vitals WDL. Also complaining of some nausea but no vomiting.  Also says that her ear has been "clogging".

## 2013-02-10 NOTE — ED Notes (Signed)
[id number]<CH> Expected date:<BR> Expected time:<BR> Means of arrival:<BR> Comments:<BR>

## 2013-02-10 NOTE — ED Provider Notes (Signed)
History     CSN: [id number]  Arrival date & time 02/10/13  17   First MD Initiated Contact with Patient 02/10/13 1104      Chief Complaint  Patient presents with  . Shortness of Breath  . Headache  . Chest Pain    (Consider location/radiation/quality/duration/timing/severity/associated sxs/prior treatment) HPI This 37 year old female has had recurrent chest pain off-and-on for the last few months, she now has a flareup of her sudden sharp stabbing anterior chest pain for the last 3 days constantly 24 hours a day worse with palpation and torso movements nonexertional it is slightly pleuritic with no cough but the pain makes her feel short of breath at times she also has a couple of weeks of a constant feeling of fullness or congestion in her right ear with mild gradual onset mild headache at the area as well but no sudden headache no severe headache no trauma no stiff neck no change in speech vision swallowing or understanding no focal or lateralizing weakness numbness or incoordination other than yesterday she had some intermittent numbness and or tingling to her left arm for several minutes at a time which is now presents today, she is PERC negative. She has had chest pain like this in the past thought to be chest wall pain and or panic attacks and anxiety related. There is no treatment prior to arrival. Past Medical History  Diagnosis Date  . Anemia     History reviewed. No pertinent past surgical history.  Family History  Problem Relation Age of Onset  . Cancer Other   . Hypertension Other     History  Substance Use Topics  . Smoking status: Current Every Day Smoker    Types: Cigarettes  . Smokeless tobacco: Not on file  . Alcohol Use: Yes     Comment: occ    OB History   Grav Para Term Preterm Abortions TAB SAB Ect Mult Living                  Review of Systems 10 Systems reviewed and are negative for acute change except as noted in the HPI. Allergies   Percocet  Home Medications   Current Outpatient Rx  Name  Route  Sig  Dispense  Refill  . carbamide peroxide (DEBROX) 6.5 % otic solution   Both Ears   Place 5 drops into both ears as needed. For ear congestion.         Marland Kitchen ibuprofen (ADVIL,MOTRIN) 200 MG tablet   Oral   Take 200-400 mg by mouth every 6 (six) hours as needed for pain.         . Multiple Vitamin (MULTIVITAMIN WITH MINERALS) TABS   Oral   Take 1 tablet by mouth daily.           BP 113/57  Pulse 61  Temp(Src) 97.9 F (36.6 C) (Oral)  Resp 17  SpO2 100%  LMP 01/24/2013  Physical Exam  Nursing note and vitals reviewed. Constitutional:  Awake, alert, nontoxic appearance with baseline speech for patient.  HENT:  Head: Atraumatic.  Mouth/Throat: No oropharyngeal exudate.  Tympanic membranes clear on the right with clear external auditory canal on the right the left external auditory canal is occluded by cerumen and the patient use over-the-counter ear wax removal drops for that  Eyes: EOM are normal. Pupils are equal, round, and reactive to light. Right eye exhibits no discharge. Left eye exhibits no discharge.  Neck: Neck supple.  Cardiovascular: Normal rate and  regular rhythm.   No murmur heard. Pulmonary/Chest: Effort normal and breath sounds normal. No stridor. No respiratory distress. She has no wheezes. She has no rales. She exhibits tenderness.  Exactly reproducible chest wall tenderness  Abdominal: Soft. Bowel sounds are normal. She exhibits no mass. There is no tenderness. There is no rebound.  Musculoskeletal: She exhibits no edema and no tenderness.  Baseline ROM, moves extremities with no obvious new focal weakness.  Lymphadenopathy:    She has no cervical adenopathy.  Neurological: She is alert.  Awake, alert, cooperative and aware of situation; motor strength bilaterally; sensation normal to light touch bilaterally; peripheral visual fields full to confrontation; no facial asymmetry; tongue  midline; major cranial nerves appear intact; no pronator drift, normal finger to nose bilaterally, baseline gait without new ataxia.  Skin: No rash noted.  Psychiatric: She has a normal mood and affect.    ED Course  Procedures (including critical care time) ECG: Normal sinus rhythm, ventricular rate 68, right axis deviation, no acute ischemic changes noted, normal intervals, no significant change noted compared with April 2014 Patient informed of clinical course, understand medical decision-making process, and agree with plan.  Labs Reviewed  POCT I-STAT TROPONIN I   No results found.   1. Chest wall pain       MDM  Clinically I doubt acute coronary syndrome, pulmonary embolism, subarachnoid hemorrhage, stroke, serious patch illness or other EMC.        [name], MD 02/21/13 [phone]

## 2013-07-06 DIAGNOSIS — Z79899 Other long term (current) drug therapy: Secondary | ICD-10-CM | POA: Insufficient documentation

## 2013-07-06 DIAGNOSIS — F17200 Nicotine dependence, unspecified, uncomplicated: Secondary | ICD-10-CM | POA: Insufficient documentation

## 2013-07-06 DIAGNOSIS — R51 Headache: Secondary | ICD-10-CM | POA: Insufficient documentation

## 2013-07-06 DIAGNOSIS — Z862 Personal history of diseases of the blood and blood-forming organs and certain disorders involving the immune mechanism: Secondary | ICD-10-CM | POA: Insufficient documentation

## 2013-07-06 DIAGNOSIS — R0789 Other chest pain: Secondary | ICD-10-CM | POA: Insufficient documentation

## 2013-07-07 ENCOUNTER — Encounter (HOSPITAL_COMMUNITY): Payer: Self-pay | Admitting: Emergency Medicine

## 2013-07-07 ENCOUNTER — Emergency Department (HOSPITAL_COMMUNITY)
Admission: EM | Admit: 2013-07-07 | Discharge: 2013-07-07 | Disposition: A | Discharge status: Home / Self Care | Payer: Self-pay | Attending: Emergency Medicine | Admitting: Emergency Medicine

## 2013-07-07 ENCOUNTER — Emergency Department (HOSPITAL_COMMUNITY): Payer: BC Managed Care – PPO

## 2013-07-07 DIAGNOSIS — R079 Chest pain, unspecified: Secondary | ICD-10-CM

## 2013-07-07 LAB — BASIC METABOLIC PANEL
CO2: 23 mEq/L (ref 19–32)
Calcium: 9 mg/dL (ref 8.4–10.5)
Chloride: 101 mEq/L (ref 96–112)
GFR calc Af Amer: >90 mL/min (ref >90)
GFR calc non Af Amer: >90 mL/min (ref >90)
Sodium: 134 mEq/L — ABNORMAL LOW (ref 135–145)

## 2013-07-07 LAB — CBC
MCH: 28.4 pg (ref 26.0–34.0)
Platelets: 196 10*3/uL (ref 150–400)
RBC: 4.26 MIL/uL (ref 3.87–5.11)
RDW: 13.5 % (ref 11.5–15.5)
WBC: 4.7 10*3/uL (ref 4.0–10.5)

## 2013-07-07 MED ORDER — KETOROLAC TROMETHAMINE 30 MG/ML IJ SOLN
30.0000 mg | Freq: Once | INTRAMUSCULAR | Status: AC
  Administered 2013-07-07: 30 mg via INTRAVENOUS
  Filled 2013-07-07: qty 1

## 2013-07-07 NOTE — ED Provider Notes (Signed)
CSN: [id number]     Arrival date & time 07/06/13  2322 History   First MD Initiated Contact with Patient 07/07/13 [phone]     Chief Complaint  Patient presents with  . Headache  . Chest Pain   (Consider location/radiation/quality/duration/timing/severity/associated sxs/prior Treatment) HPI Patient presents with headache and chest pain.  Symptoms began without clear precipitant, and since onset there is no discomfort about the anterior chest.  Symptoms may be worse when lying flat, though this is inconsistent.  The pain is waxing/waning.  It is most prominent about the left superior sternal costal area.  There is associated mild diffuse headache.  There is no concurrent confusion, disorientation, visual changes, falls, weakness, abdominal pain, nausea, vomiting, diarrhea, fever, chills. No attempts at relief with any medication thus far.  Past Medical History  Diagnosis Date  . Anemia    History reviewed. No pertinent past surgical history. Family History  Problem Relation Age of Onset  . Cancer Other   . Hypertension Other    History  Substance Use Topics  . Smoking status: Current Every Day Smoker    Types: Cigarettes  . Smokeless tobacco: Not on file  . Alcohol Use: Yes     Comment: occ   OB History   Grav Para Term Preterm Abortions TAB SAB Ect Mult Living                 Review of Systems  Constitutional:       Per HPI, otherwise negative  HENT:       Per HPI, otherwise negative  Respiratory:       Per HPI, otherwise negative  Cardiovascular:       Per HPI, otherwise negative  Gastrointestinal: Negative for vomiting.  Endocrine:       Negative aside from HPI  Genitourinary:       Neg aside from HPI   Musculoskeletal:       Per HPI, otherwise negative  Skin: Negative.   Neurological: Negative for syncope.    Allergies  Percocet  Home Medications   Current Outpatient Rx  Name  Route  Sig  Dispense  Refill  . ibuprofen (ADVIL,MOTRIN) 200 MG tablet    Oral   Take 200-400 mg by mouth every 6 (six) hours as needed for pain.         . Multiple Vitamin (MULTIVITAMIN WITH MINERALS) TABS   Oral   Take 1 tablet by mouth daily.         . ranitidine (ZANTAC) 150 MG tablet   Oral   Take 150 mg by mouth 2 (two) times daily.          BP 104/66  Pulse 61  Temp(Src) 98.6 F (37 C) (Oral)  Resp 16  Ht 5' 9"  (1.753 m)  Wt 137 lb (62.143 kg)  BMI 20.22 kg/m2  SpO2 100%  LMP 06/10/2013 Physical Exam  Nursing note and vitals reviewed. Constitutional: She is oriented to person, place, and time. She appears well-developed and well-nourished. No distress.  HENT:  Head: Normocephalic and atraumatic.  Eyes: Conjunctivae and EOM are normal.  Neck: Neck supple. No tracheal deviation present.  Cardiovascular: Normal rate and regular rhythm.   Pulmonary/Chest: Effort normal and breath sounds normal. No stridor. No respiratory distress.    Abdominal: She exhibits no distension.  Musculoskeletal: She exhibits no edema.  Lymphadenopathy:    She has no cervical adenopathy.  Neurological: She is alert and oriented to person, place, and time. No cranial  nerve deficit.  Skin: Skin is warm and dry.  Psychiatric: She has a normal mood and affect.    ED Course  Procedures (including critical care time) Labs Review Labs Reviewed  BASIC METABOLIC PANEL - Abnormal; Notable for the following:    Sodium 134 (*)    All other components within normal limits  CBC   Imaging Review No results found.  ECG: sr rate 64, normal  O2- 99%ra, normal  4:20 AM On repeat exam the patient appears comfortable.  We discussed all results.  MDM  No diagnosis found. This previously well young female presents with new chest pain, headache.  Notably, the patient has exquisite tenderness to palpation and with any pressure of the sternum.  On review, the patient now states that she has been having strenuous upper body activity.  With this physical exam findings,  the absence of notable lab or radiographic findings, and the patient's absence of risk factors for ACS, there is low suspicion for either infection or atypical coronary disease.  She was discharged with a course of anti-inflammatories, primary care followup.    Carmin Muskrat, MD 07/07/13 [phone]

## 2013-07-07 NOTE — ED Notes (Signed)
Pt c/o headache, chest pain x 3 days and shortness when lying flat or speaking for extended periods. Pt also concerned about "knot" to L chest. A & O. NAD

## 2013-11-08 ENCOUNTER — Other Ambulatory Visit: Payer: Self-pay | Admitting: Gastroenterology

## 2013-11-08 DIAGNOSIS — R112 Nausea with vomiting, unspecified: Secondary | ICD-10-CM

## 2013-11-08 DIAGNOSIS — R1013 Epigastric pain: Secondary | ICD-10-CM

## 2013-11-10 ENCOUNTER — Emergency Department (HOSPITAL_COMMUNITY): Payer: BC Managed Care – PPO

## 2013-11-10 ENCOUNTER — Encounter (HOSPITAL_COMMUNITY): Payer: Self-pay | Admitting: Emergency Medicine

## 2013-11-10 ENCOUNTER — Emergency Department (HOSPITAL_COMMUNITY)
Admission: EM | Admit: 2013-11-10 | Discharge: 2013-11-11 | Disposition: A | Discharge status: Home / Self Care | Payer: BC Managed Care – PPO | Attending: Emergency Medicine | Admitting: Emergency Medicine

## 2013-11-10 DIAGNOSIS — W2209XA Striking against other stationary object, initial encounter: Secondary | ICD-10-CM | POA: Insufficient documentation

## 2013-11-10 DIAGNOSIS — F17200 Nicotine dependence, unspecified, uncomplicated: Secondary | ICD-10-CM | POA: Insufficient documentation

## 2013-11-10 DIAGNOSIS — S60229A Contusion of unspecified hand, initial encounter: Secondary | ICD-10-CM | POA: Insufficient documentation

## 2013-11-10 DIAGNOSIS — Y92009 Unspecified place in unspecified non-institutional (private) residence as the place of occurrence of the external cause: Secondary | ICD-10-CM | POA: Insufficient documentation

## 2013-11-10 DIAGNOSIS — Z862 Personal history of diseases of the blood and blood-forming organs and certain disorders involving the immune mechanism: Secondary | ICD-10-CM | POA: Insufficient documentation

## 2013-11-10 DIAGNOSIS — Y9389 Activity, other specified: Secondary | ICD-10-CM | POA: Insufficient documentation

## 2013-11-10 DIAGNOSIS — S60221A Contusion of right hand, initial encounter: Secondary | ICD-10-CM

## 2013-11-10 MED ORDER — HYDROCODONE-ACETAMINOPHEN 5-325 MG PO TABS
2.0000 | ORAL_TABLET | Freq: Once | ORAL | Status: AC
  Administered 2013-11-10: 2 via ORAL
  Filled 2013-11-10: qty 2

## 2013-11-10 NOTE — Discharge Instructions (Signed)
Hand Contusion °A hand contusion is a deep bruise on your hand area. Contusions are the result of an injury that caused bleeding under the skin. The contusion may turn blue, purple, or yellow. Minor injuries will give you a painless contusion, but more severe contusions may stay painful and swollen for a few weeks. °CAUSES  °A contusion is usually caused by a blow, trauma, or direct force to an area of the body. °SYMPTOMS  °· Swelling and redness of the injured area. °· Discoloration of the injured area. °· Tenderness and soreness of the injured area. °· Pain. °DIAGNOSIS  °The diagnosis can be made by taking a history and performing a physical exam. An X-ray, CT scan, or MRI may be needed to determine if there were any associated injuries, such as broken bones (fractures). °TREATMENT  °Often, the best treatment for a hand contusion is resting, elevating, icing, and applying cold compresses to the injured area. Over-the-counter medicines may also be recommended for pain control. °HOME CARE INSTRUCTIONS  °· Put ice on the injured area. °· Put ice in a plastic bag. °· Place a towel between your skin and the bag. °· Leave the ice on for 15-20 minutes, 03-04 times a day. °· Only take over-the-counter or prescription medicines as directed by your caregiver. Your caregiver may recommend avoiding anti-inflammatory medicines (aspirin, ibuprofen, and naproxen) for 48 hours because these medicines may increase bruising. °· If told, use an elastic wrap as directed. This can help reduce swelling. You may remove the wrap for sleeping, showering, and bathing. If your fingers become numb, cold, or blue, take the wrap off and reapply it more loosely. °· Elevate your hand with pillows to reduce swelling. °· Avoid overusing your hand if it is painful. °SEEK IMMEDIATE MEDICAL CARE IF:  °· You have increased redness, swelling, or pain in your hand. °· Your swelling or pain is not relieved with medicines. °· You have loss of feeling in  your hand or are unable to move your fingers. °· Your hand turns cold or blue. °· You have pain when you move your fingers. °· Your hand becomes warm to the touch. °· Your contusion does not improve in 2 days. °MAKE SURE YOU:  °· Understand these instructions. °· Will watch your condition. °· Will get help right away if you are not doing well or get worse. °Document Released: 03/07/2002 Document Revised: 06/09/2012 Document Reviewed: 03/08/2012 °[employer]® Patient Information ©2014 [employer]. ° °

## 2013-11-10 NOTE — ED Notes (Signed)
Ice provided to R hand.

## 2013-11-10 NOTE — ED Notes (Signed)
Pt has a ride home.  
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
## 2013-11-11 NOTE — ED Provider Notes (Signed)
Medical screening examination/treatment/procedure(s) were performed by non-physician practitioner and as supervising physician I was immediately available for consultation/collaboration.  EKG Interpretation   None         Ephraim Hamburger, MD 11/11/13 [phone]

## 2013-11-25 ENCOUNTER — Ambulatory Visit (HOSPITAL_COMMUNITY)
Admission: RE | Admit: 2013-11-25 | Discharge: 2013-11-25 | Disposition: A | Discharge status: Home / Self Care | Payer: BC Managed Care – PPO | Source: Ambulatory Visit | Attending: Gastroenterology | Admitting: Gastroenterology

## 2013-11-25 DIAGNOSIS — R112 Nausea with vomiting, unspecified: Secondary | ICD-10-CM

## 2013-11-25 DIAGNOSIS — R1013 Epigastric pain: Secondary | ICD-10-CM

## 2013-11-25 MED ORDER — STERILE WATER FOR INJECTION IJ SOLN
INTRAMUSCULAR | Status: AC
  Filled 2013-11-25: qty 10

## 2013-11-25 MED ORDER — SINCALIDE 5 MCG IJ SOLR
INTRAMUSCULAR | Status: AC
  Administered 2013-11-25: 1.23 ug via INTRAVENOUS
  Filled 2013-11-25: qty 5

## 2013-11-25 MED ORDER — TECHNETIUM TC 99M MEBROFENIN IV KIT
5.0000 | PACK | Freq: Once | INTRAVENOUS | Status: AC | PRN
  Administered 2013-11-25: 5 via INTRAVENOUS

## 2013-11-25 MED ORDER — SINCALIDE 5 MCG IJ SOLR
0.0200 ug/kg | Freq: Once | INTRAMUSCULAR | Status: AC
  Administered 2013-11-25: 1.23 ug via INTRAVENOUS

## 2014-01-03 ENCOUNTER — Encounter (INDEPENDENT_AMBULATORY_CARE_PROVIDER_SITE_OTHER): Payer: Self-pay | Admitting: Surgery

## 2014-01-23 ENCOUNTER — Ambulatory Visit (INDEPENDENT_AMBULATORY_CARE_PROVIDER_SITE_OTHER): Payer: BC Managed Care – PPO | Admitting: Surgery

## 2014-02-03 ENCOUNTER — Ambulatory Visit (INDEPENDENT_AMBULATORY_CARE_PROVIDER_SITE_OTHER): Payer: BC Managed Care – PPO | Admitting: Surgery

## 2014-02-17 ENCOUNTER — Ambulatory Visit (INDEPENDENT_AMBULATORY_CARE_PROVIDER_SITE_OTHER): Payer: BC Managed Care – PPO | Admitting: Surgery

## 2014-02-17 ENCOUNTER — Encounter (INDEPENDENT_AMBULATORY_CARE_PROVIDER_SITE_OTHER): Payer: Self-pay | Admitting: Surgery

## 2014-02-17 VITALS — BP 130/72 | HR 54 | Temp 98.3°F | Ht 69.0 in | Wt 133.0 lb

## 2014-02-17 DIAGNOSIS — K811 Chronic cholecystitis: Secondary | ICD-10-CM | POA: Insufficient documentation

## 2014-02-17 NOTE — Progress Notes (Signed)
Patient ID: Nakeda Lebron, female   DOB: 12-24-1975, 38 y.o.   MRN: [id number]  Chief Complaint  Patient presents with  . eval gallbladder    HPI Ellar Hakala is a 38 y.o. female.   HPI She is referred by Dr. Collene Mares.  She has been having nausea and vomiting intermittently for one year.  She also has mild to moderate epigastric cramping pain.  This only occurs after fatty meals.  BM's are fairly normal.  She is loosing weight.  She is otherwise healthy and without complaints Past Medical History  Diagnosis Date  . Anemia   . GERD (gastroesophageal reflux disease)     History reviewed. No pertinent past surgical history.  Family History  Problem Relation Age of Onset  . Cancer Other   . Hypertension Other     Social History History  Substance Use Topics  . Smoking status: Current Every Day Smoker -- 0.50 packs/day    Types: Cigarettes  . Smokeless tobacco: Not on file  . Alcohol Use: Yes     Comment: occ    Allergies  Allergen Reactions  . Percocet [Oxycodone-Acetaminophen] Nausea And Vomiting    Current Outpatient Prescriptions  Medication Sig Dispense Refill  . omeprazole (PRILOSEC) 20 MG capsule Take 20 mg by mouth daily.       No current facility-administered medications for this visit.    Review of Systems Review of Systems  Constitutional: Negative for fever, chills and unexpected weight change.  HENT: Negative for congestion, hearing loss, sore throat, trouble swallowing and voice change.   Eyes: Negative for visual disturbance.  Respiratory: Negative for cough and wheezing.   Cardiovascular: Negative for chest pain, palpitations and leg swelling.  Gastrointestinal: Positive for nausea, vomiting and abdominal pain. Negative for diarrhea, constipation, blood in stool, abdominal distention and anal bleeding.  Genitourinary: Negative for hematuria, vaginal bleeding and difficulty urinating.  Musculoskeletal: Negative for arthralgias.  Skin: Negative for rash and  wound.  Neurological: Negative for seizures, syncope and headaches.  Hematological: Negative for adenopathy. Does not bruise/bleed easily.  Psychiatric/Behavioral: Negative for confusion.    Blood pressure 130/72, pulse 54, temperature 98.3 F (36.8 C), height 5' 9"  (1.753 m), weight 133 lb (60.328 kg).  Physical Exam Physical Exam  Constitutional: She is oriented to person, place, and time. She appears well-developed and well-nourished. No distress.  HENT:  Head: Normocephalic and atraumatic.  Right Ear: External ear normal.  Left Ear: External ear normal.  Nose: Nose normal.  Mouth/Throat: Oropharynx is clear and moist.  Eyes: Conjunctivae are normal. Pupils are equal, round, and reactive to light. Right eye exhibits no discharge. Left eye exhibits no discharge. No scleral icterus.  Neck: Normal range of motion. No tracheal deviation present.  Cardiovascular: Normal rate, regular rhythm, normal heart sounds and intact distal pulses.   No murmur heard. Pulmonary/Chest: Effort normal and breath sounds normal. No respiratory distress. She has no wheezes. She has no rales.  Abdominal: Soft. Bowel sounds are normal. She exhibits no distension. There is no tenderness. There is no guarding.  Mild tenderness with guarding in the epigastrium  Musculoskeletal: Normal range of motion. She exhibits no edema and no tenderness.  Lymphadenopathy:    She has no cervical adenopathy.  Neurological: She is alert and oriented to person, place, and time.  Skin: Skin is warm and dry. No rash noted. She is not diaphoretic. No erythema.  Psychiatric: Her behavior is normal. Judgment normal.    Data Reviewed Notes reviewed Upper endo  is negative.  U/S shows no gallstones.  HIDA scan showed normal EF but pain was reproduced with CCK  Assessment    Chronic cholecystitis    Plan    I discussed this with her in detail.  I recommend lap chole.  She is eager to proceed. I discussed the procedure in  detail.  The patient was given Neurosurgeon.  We discussed the risks and benefits of a laparoscopic cholecystectomy and possible cholangiogram including, but not limited to bleeding, infection, injury to surrounding structures such as the intestine or liver, bile leak, retained gallstones, need to convert to an open procedure, prolonged diarrhea, blood clots such as  DVT, common bile duct injury, anesthesia risks, and possible need for additional procedures.  The likelihood of improvement in symptoms and return to the patient's normal status is good. We discussed the typical post-operative recovery course.       Harl Bowie 02/17/2014, 9:24 AM

## 2014-02-21 ENCOUNTER — Encounter (HOSPITAL_COMMUNITY): Payer: Self-pay | Admitting: Pharmacy Technician

## 2014-02-21 ENCOUNTER — Encounter (HOSPITAL_COMMUNITY)
Admission: RE | Admit: 2014-02-21 | Discharge: 2014-02-21 | Disposition: A | Discharge status: Home / Self Care | Payer: BC Managed Care – PPO | Source: Ambulatory Visit | Attending: Orthopaedic Surgery | Admitting: Orthopaedic Surgery

## 2014-02-21 ENCOUNTER — Encounter (HOSPITAL_COMMUNITY): Payer: Self-pay

## 2014-02-21 HISTORY — DX: Personal history of other diseases of the nervous system and sense organs: Z86.69

## 2014-02-21 LAB — CBC
HCT: 32.6 % — ABNORMAL LOW (ref 36.0–46.0)
Hemoglobin: 10.3 g/dL — ABNORMAL LOW (ref 12.0–15.0)
MCH: 26.7 pg (ref 26.0–34.0)
MCHC: 31.6 g/dL (ref 30.0–36.0)
MCV: 84.5 fL (ref 78.0–100.0)
PLATELETS: 203 10*3/uL (ref 150–400)
RBC: 3.86 MIL/uL — AB (ref 3.87–5.11)
RDW: 14 % (ref 11.5–15.5)
WBC: 5.4 10*3/uL (ref 4.0–10.5)

## 2014-02-21 LAB — HCG, SERUM, QUALITATIVE: PREG SERUM: NEGATIVE

## 2014-02-21 MED ORDER — CEFAZOLIN SODIUM-DEXTROSE 2-3 GM-% IV SOLR
2.0000 g | INTRAVENOUS | Status: AC
  Administered 2014-02-22: 2 g via INTRAVENOUS
  Filled 2014-02-21: qty 50

## 2014-02-21 NOTE — Progress Notes (Signed)
Pt doesn't have a cardiologist  Denies ever having an echo/stress test/heart cath  EKG and CXR in epic from 07-07-13  [place] is with [facility]-DR.Linda Hedges

## 2014-02-21 NOTE — Progress Notes (Signed)
Pt lab result not back for Hcg; please review result on DOS.

## 2014-02-21 NOTE — Pre-Procedure Instructions (Signed)
Beva Remund  02/21/2014   Your procedure is scheduled on:  Wed, May 27 @ 8:30 AM  Report to [facility]  at 6:30 AM.  Call this number if you have problems the morning of surgery: [phone]   Remember:   Do not eat food or drink liquids after midnight.   Take these medicines the morning of surgery with A SIP OF WATER: Omeprazole(Prilosec)              No Goody's,BC's,Aleve,Aspirin,Ibuprofen,Fish Oil,or any Herbal Medications   Do not wear jewelry, make-up or nail polish.  Do not wear lotions, powders, or perfumes. You may wear deodorant.  Do not shave 48 hours prior to surgery.   Do not bring valuables to the hospital.  [facility] is not responsible                  for any belongings or valuables.               Contacts, dentures or bridgework may not be worn into surgery.  Leave suitcase in the car. After surgery it may be brought to your room.  For patients admitted to the hospital, discharge time is determined by your                treatment team.               Patients discharged the day of surgery will not be allowed to drive  home.    Special Instructions:   - Preparing for Surgery  Before surgery, you can play an important role.  Because skin is not sterile, your skin needs to be as free of germs as possible.  You can reduce the number of germs on you skin by washing with CHG (chlorahexidine gluconate) soap before surgery.  CHG is an antiseptic cleaner which kills germs and bonds with the skin to continue killing germs even after washing.  Please DO NOT use if you have an allergy to CHG or antibacterial soaps.  If your skin becomes reddened/irritated stop using the CHG and inform your nurse when you arrive at Short Stay.  Do not shave (including legs and underarms) for at least 48 hours prior to the first CHG shower.  You may shave your face.  Please follow these instructions carefully:   1.  Shower with CHG Soap the night before surgery and the                                 morning of Surgery.  2.  If you choose to wash your hair, wash your hair first as usual with your       normal shampoo.  3.  After you shampoo, rinse your hair and body thoroughly to remove the                      Shampoo.  4.  Use CHG as you would any other liquid soap.  You can apply chg directly       to the skin and wash gently with scrungie or a clean washcloth.  5.  Apply the CHG Soap to your body ONLY FROM THE NECK DOWN.        Do not use on open wounds or open sores.  Avoid contact with your eyes,       ears, mouth and genitals (private parts).  Wash genitals (  private parts)       with your normal soap.  6.  Wash thoroughly, paying special attention to the area where your surgery        will be performed.  7.  Thoroughly rinse your body with warm water from the neck down.  8.  DO NOT shower/wash with your normal soap after using and rinsing off       the CHG Soap.  9.  Pat yourself dry with a clean towel.            10.  Wear clean pajamas.            11.  Place clean sheets on your bed the night of your first shower and do not        sleep with pets.  Day of Surgery  Do not apply any lotions/deoderants the morning of surgery.  Please wear clean clothes to the hospital/surgery center.     Please read over the following fact sheets that you were given: Pain Booklet, Coughing and Deep Breathing and Surgical Site Infection Prevention

## 2014-02-22 ENCOUNTER — Encounter (HOSPITAL_COMMUNITY)
Admission: RE | Disposition: A | Discharge status: Home / Self Care | Payer: Self-pay | Source: Ambulatory Visit | Attending: Surgery

## 2014-02-22 ENCOUNTER — Ambulatory Visit (HOSPITAL_COMMUNITY)
Admission: RE | Admit: 2014-02-22 | Discharge: 2014-02-22 | Disposition: A | Discharge status: Home / Self Care | Payer: BC Managed Care – PPO | Source: Ambulatory Visit | Attending: Surgery | Admitting: Surgery

## 2014-02-22 ENCOUNTER — Encounter (HOSPITAL_COMMUNITY): Payer: BC Managed Care – PPO | Admitting: Certified Registered Nurse Anesthetist

## 2014-02-22 ENCOUNTER — Ambulatory Visit (HOSPITAL_COMMUNITY): Payer: BC Managed Care – PPO | Admitting: Certified Registered Nurse Anesthetist

## 2014-02-22 DIAGNOSIS — K811 Chronic cholecystitis: Secondary | ICD-10-CM

## 2014-02-22 DIAGNOSIS — Z885 Allergy status to narcotic agent status: Secondary | ICD-10-CM | POA: Insufficient documentation

## 2014-02-22 DIAGNOSIS — Z79899 Other long term (current) drug therapy: Secondary | ICD-10-CM | POA: Insufficient documentation

## 2014-02-22 DIAGNOSIS — D649 Anemia, unspecified: Secondary | ICD-10-CM | POA: Insufficient documentation

## 2014-02-22 DIAGNOSIS — K219 Gastro-esophageal reflux disease without esophagitis: Secondary | ICD-10-CM | POA: Insufficient documentation

## 2014-02-22 DIAGNOSIS — F17200 Nicotine dependence, unspecified, uncomplicated: Secondary | ICD-10-CM | POA: Insufficient documentation

## 2014-02-22 HISTORY — PX: CHOLECYSTECTOMY: SHX55

## 2014-02-22 SURGERY — LAPAROSCOPIC CHOLECYSTECTOMY
Anesthesia: General | Site: Abdomen

## 2014-02-22 MED ORDER — LIDOCAINE HCL (CARDIAC) 20 MG/ML IV SOLN
INTRAVENOUS | Status: AC
  Filled 2014-02-22: qty 5

## 2014-02-22 MED ORDER — MEPERIDINE HCL 25 MG/ML IJ SOLN: 6.2500 mg | INTRAMUSCULAR | Status: DC | PRN

## 2014-02-22 MED ORDER — LACTATED RINGERS IV SOLN
INTRAVENOUS | Status: DC | PRN
  Administered 2014-02-22: 08:00:00 via INTRAVENOUS

## 2014-02-22 MED ORDER — LACTATED RINGERS IV SOLN: INTRAVENOUS | Status: DC

## 2014-02-22 MED ORDER — ACETAMINOPHEN 650 MG RE SUPP: 650.0000 mg | RECTAL | Status: DC | PRN

## 2014-02-22 MED ORDER — GLYCOPYRROLATE 0.2 MG/ML IJ SOLN
INTRAMUSCULAR | Status: DC | PRN
  Administered 2014-02-22: 0.4 mg via INTRAVENOUS

## 2014-02-22 MED ORDER — FENTANYL CITRATE 0.05 MG/ML IJ SOLN
25.0000 ug | INTRAMUSCULAR | Status: DC | PRN
  Administered 2014-02-22 (×2): 50 ug via INTRAVENOUS

## 2014-02-22 MED ORDER — PROPOFOL 10 MG/ML IV BOLUS
INTRAVENOUS | Status: DC | PRN
  Administered 2014-02-22: 170 mg via INTRAVENOUS

## 2014-02-22 MED ORDER — ONDANSETRON HCL 4 MG/2ML IJ SOLN
INTRAMUSCULAR | Status: DC | PRN
  Administered 2014-02-22: 4 mg via INTRAVENOUS

## 2014-02-22 MED ORDER — ACETAMINOPHEN 325 MG PO TABS
ORAL_TABLET | ORAL | Status: AC
  Filled 2014-02-22: qty 2

## 2014-02-22 MED ORDER — ACETAMINOPHEN 325 MG PO TABS
650.0000 mg | ORAL_TABLET | ORAL | Status: DC | PRN
  Administered 2014-02-22: 650 mg via ORAL

## 2014-02-22 MED ORDER — MORPHINE SULFATE 2 MG/ML IJ SOLN: 1.0000 mg | INTRAMUSCULAR | Status: DC | PRN

## 2014-02-22 MED ORDER — LIDOCAINE HCL (CARDIAC) 20 MG/ML IV SOLN
INTRAVENOUS | Status: DC | PRN
  Administered 2014-02-22: 100 mg via INTRAVENOUS

## 2014-02-22 MED ORDER — BUPIVACAINE-EPINEPHRINE 0.25% -1:200000 IJ SOLN
INTRAMUSCULAR | Status: DC | PRN
  Administered 2014-02-22: 20 mL

## 2014-02-22 MED ORDER — 0.9 % SODIUM CHLORIDE (POUR BTL) OPTIME
TOPICAL | Status: DC | PRN
  Administered 2014-02-22: 1000 mL

## 2014-02-22 MED ORDER — MIDAZOLAM HCL 2 MG/2ML IJ SOLN
INTRAMUSCULAR | Status: AC
  Filled 2014-02-22: qty 2

## 2014-02-22 MED ORDER — ROCURONIUM BROMIDE 50 MG/5ML IV SOLN
INTRAVENOUS | Status: AC
  Filled 2014-02-22: qty 1

## 2014-02-22 MED ORDER — HYDROCODONE-ACETAMINOPHEN 5-325 MG PO TABS: 1.0000 | ORAL_TABLET | ORAL | Status: DC | PRN

## 2014-02-22 MED ORDER — FENTANYL CITRATE 0.05 MG/ML IJ SOLN
INTRAMUSCULAR | Status: AC
  Administered 2014-02-22: 50 ug via INTRAVENOUS
  Filled 2014-02-22: qty 2

## 2014-02-22 MED ORDER — FENTANYL CITRATE 0.05 MG/ML IJ SOLN
INTRAMUSCULAR | Status: AC
  Filled 2014-02-22: qty 5

## 2014-02-22 MED ORDER — ROCURONIUM BROMIDE 100 MG/10ML IV SOLN
INTRAVENOUS | Status: DC | PRN
  Administered 2014-02-22: 25 mg via INTRAVENOUS

## 2014-02-22 MED ORDER — NEOSTIGMINE METHYLSULFATE 10 MG/10ML IV SOLN
INTRAVENOUS | Status: DC | PRN
  Administered 2014-02-22: 3 mg via INTRAVENOUS

## 2014-02-22 MED ORDER — NEOSTIGMINE METHYLSULFATE 10 MG/10ML IV SOLN
INTRAVENOUS | Status: AC
  Filled 2014-02-22: qty 1

## 2014-02-22 MED ORDER — PROMETHAZINE HCL 25 MG/ML IJ SOLN: 6.2500 mg | INTRAMUSCULAR | Status: DC | PRN

## 2014-02-22 MED ORDER — MIDAZOLAM HCL 5 MG/5ML IJ SOLN
INTRAMUSCULAR | Status: DC | PRN
  Administered 2014-02-22: 2 mg via INTRAVENOUS

## 2014-02-22 MED ORDER — GLYCOPYRROLATE 0.2 MG/ML IJ SOLN
INTRAMUSCULAR | Status: AC
  Filled 2014-02-22: qty 2

## 2014-02-22 MED ORDER — PROPOFOL 10 MG/ML IV BOLUS
INTRAVENOUS | Status: AC
  Filled 2014-02-22: qty 20

## 2014-02-22 MED ORDER — DEXAMETHASONE SODIUM PHOSPHATE 4 MG/ML IJ SOLN
INTRAMUSCULAR | Status: DC | PRN
  Administered 2014-02-22: 4 mg via INTRAVENOUS

## 2014-02-22 MED ORDER — FENTANYL CITRATE 0.05 MG/ML IJ SOLN
INTRAMUSCULAR | Status: DC | PRN
  Administered 2014-02-22 (×2): 50 ug via INTRAVENOUS
  Administered 2014-02-22: 25 ug via INTRAVENOUS

## 2014-02-22 MED ORDER — BUPIVACAINE-EPINEPHRINE (PF) 0.25% -1:200000 IJ SOLN
INTRAMUSCULAR | Status: AC
  Filled 2014-02-22: qty 30

## 2014-02-22 MED ORDER — KETOROLAC TROMETHAMINE 30 MG/ML IJ SOLN
INTRAMUSCULAR | Status: DC | PRN
  Administered 2014-02-22: 30 mg via INTRAVENOUS

## 2014-02-22 SURGICAL SUPPLY — 39 items
APL SKNCLS STERI-STRIP NONHPOA (GAUZE/BANDAGES/DRESSINGS) ×1
APPLIER CLIP 5 13 M/L LIGAMAX5 (MISCELLANEOUS) ×2
APR CLP MED LRG 5 ANG JAW (MISCELLANEOUS) ×1
BAG SPEC RTRVL LRG 6X4 10 (ENDOMECHANICALS) ×1
BANDAGE ADHESIVE 1X3 (GAUZE/BANDAGES/DRESSINGS) ×8 IMPLANT
BENZOIN TINCTURE PRP APPL 2/3 (GAUZE/BANDAGES/DRESSINGS) ×2 IMPLANT
CANISTER SUCTION 2500CC (MISCELLANEOUS) ×2 IMPLANT
CHLORAPREP W/TINT 26ML (MISCELLANEOUS) ×2 IMPLANT
CLIP APPLIE 5 13 M/L LIGAMAX5 (MISCELLANEOUS) ×1 IMPLANT
CLSR STERI-STRIP ANTIMIC 1/2X4 (GAUZE/BANDAGES/DRESSINGS) ×1 IMPLANT
COVER MAYO STAND STRL (DRAPES) IMPLANT
COVER SURGICAL LIGHT HANDLE (MISCELLANEOUS) ×2 IMPLANT
DECANTER SPIKE VIAL GLASS SM (MISCELLANEOUS) ×2 IMPLANT
DRAPE C-ARM 42X72 X-RAY (DRAPES) IMPLANT
DRAPE UTILITY 15X26 W/TAPE STR (DRAPE) ×4 IMPLANT
ELECT REM PT RETURN 9FT ADLT (ELECTROSURGICAL) ×2
ELECTRODE REM PT RTRN 9FT ADLT (ELECTROSURGICAL) ×1 IMPLANT
GLOVE SURG SIGNA 7.5 PF LTX (GLOVE) ×2 IMPLANT
GOWN STRL REUS W/ TWL LRG LVL3 (GOWN DISPOSABLE) ×3 IMPLANT
GOWN STRL REUS W/ TWL XL LVL3 (GOWN DISPOSABLE) ×1 IMPLANT
GOWN STRL REUS W/TWL LRG LVL3 (GOWN DISPOSABLE) ×6
GOWN STRL REUS W/TWL XL LVL3 (GOWN DISPOSABLE) ×2
KIT BASIN OR (CUSTOM PROCEDURE TRAY) ×2 IMPLANT
KIT ROOM TURNOVER OR (KITS) ×2 IMPLANT
NS IRRIG 1000ML POUR BTL (IV SOLUTION) ×2 IMPLANT
PAD ARMBOARD 7.5X6 YLW CONV (MISCELLANEOUS) ×2 IMPLANT
POUCH SPECIMEN RETRIEVAL 10MM (ENDOMECHANICALS) ×1 IMPLANT
SCISSORS LAP 5X35 DISP (ENDOMECHANICALS) ×2 IMPLANT
SET CHOLANGIOGRAPH 5 50 .035 (SET/KITS/TRAYS/PACK) IMPLANT
SET IRRIG TUBING LAPAROSCOPIC (IRRIGATION / IRRIGATOR) ×2 IMPLANT
SLEEVE ENDOPATH XCEL 5M (ENDOMECHANICALS) ×4 IMPLANT
SPECIMEN JAR SMALL (MISCELLANEOUS) ×2 IMPLANT
SUT MON AB 4-0 PC3 18 (SUTURE) ×2 IMPLANT
TOWEL OR 17X24 6PK STRL BLUE (TOWEL DISPOSABLE) ×2 IMPLANT
TOWEL OR 17X26 10 PK STRL BLUE (TOWEL DISPOSABLE) ×2 IMPLANT
TRAY LAPAROSCOPIC (CUSTOM PROCEDURE TRAY) ×2 IMPLANT
TROCAR XCEL BLUNT TIP 100MML (ENDOMECHANICALS) ×2 IMPLANT
TROCAR XCEL NON-BLD 5MMX100MML (ENDOMECHANICALS) ×2 IMPLANT
WATER STERILE IRR 1000ML POUR (IV SOLUTION) IMPLANT

## 2014-02-22 NOTE — Anesthesia Postprocedure Evaluation (Signed)
  Anesthesia Post-op Note  Patient: Whitney Jenkins  Procedure(s) Performed: Procedure(s) (LRB): LAPAROSCOPIC CHOLECYSTECTOMY (N/A)  Patient Location: PACU  Anesthesia Type: General  Level of Consciousness: awake and alert   Airway and Oxygen Therapy: Patient Spontanous Breathing  Post-op Pain: mild  Post-op Assessment: Post-op Vital signs reviewed, Patient's Cardiovascular Status Stable, Respiratory Function Stable, Patent Airway and No signs of Nausea or vomiting  Last Vitals:  Filed Vitals:   02/22/14 1100  BP: 102/57  Pulse: 56  Temp:   Resp: 14    Post-op Vital Signs: stable   Complications: No apparent anesthesia complications

## 2014-02-22 NOTE — Anesthesia Preprocedure Evaluation (Signed)
Anesthesia Evaluation  Patient identified by MRN, date of birth, ID band Patient awake    Reviewed: Allergy & Precautions, H&P , NPO status , Patient's Chart, lab work & pertinent test results  Airway  TM Distance: >3 FB Neck ROM: Full    Dental no notable dental hx.    Pulmonary Current Smoker,  breath sounds clear to auscultation  Pulmonary exam normal       Cardiovascular negative cardio ROS  Rhythm:Regular Rate:Normal     Neuro/Psych negative neurological ROS  negative psych ROS   GI/Hepatic negative GI ROS, Neg liver ROS,   Endo/Other  negative endocrine ROS  Renal/GU negative Renal ROS  negative genitourinary   Musculoskeletal negative musculoskeletal ROS (+)   Abdominal   Peds negative pediatric ROS (+)  Hematology negative hematology ROS (+)   Anesthesia Other Findings   Reproductive/Obstetrics negative OB ROS                           Anesthesia Physical Anesthesia Plan  ASA: II  Anesthesia Plan: General   Post-op Pain Management:    Induction: Intravenous  Airway Management Planned: Oral ETT  Additional Equipment:   Intra-op Plan:   Post-operative Plan: Extubation in OR  Informed Consent: I have reviewed the patients History and Physical, chart, labs and discussed the procedure including the risks, benefits and alternatives for the proposed anesthesia with the patient or authorized representative who has indicated his/her understanding and acceptance.   Dental advisory given  Plan Discussed with: CRNA  Anesthesia Plan Comments:         Anesthesia Quick Evaluation

## 2014-02-22 NOTE — Anesthesia Procedure Notes (Signed)
Procedure Name: Intubation Date/Time: 02/22/2014 8:37 AM Performed by: Trixie Deis A Pre-anesthesia Checklist: Patient identified, Timeout performed, Emergency Drugs available, Suction available and Patient being monitored Patient Re-evaluated:Patient Re-evaluated prior to inductionOxygen Delivery Method: Circle system utilized Preoxygenation: Pre-oxygenation with 100% oxygen Intubation Type: IV induction Ventilation: Mask ventilation without difficulty Laryngoscope Size: Mac and 3 Grade View: Grade I Tube type: Oral Tube size: 7.0 mm Number of attempts: 1 Airway Equipment and Method: Stylet Placement Confirmation: ETT inserted through vocal cords under direct vision,  breath sounds checked- equal and bilateral and positive ETCO2 Secured at: 21 cm Tube secured with: Tape

## 2014-02-22 NOTE — Discharge Instructions (Signed)
[facility] ______[facility] LAPAROSCOPIC SURGERY: POST OP INSTRUCTIONS Always review your discharge instruction sheet given to you by the facility where your surgery was performed. IF YOU HAVE DISABILITY OR FAMILY LEAVE FORMS, YOU MUST BRING THEM TO THE OFFICE FOR PROCESSING.   DO NOT GIVE THEM TO YOUR DOCTOR.  1. A prescription for pain medication may be given to you upon discharge.  Take your pain medication as prescribed, if needed.  If narcotic pain medicine is not needed, then you may take acetaminophen (Tylenol) or ibuprofen (Advil) as needed. 2. Take your usually prescribed medications unless otherwise directed. 3. If you need a refill on your pain medication, please contact your pharmacy.  They will contact our office to request authorization. Prescriptions will not be filled after 5pm or on week-ends. 4. You should follow a light diet the first few days after arrival home, such as soup and crackers, etc.  Be sure to include lots of fluids daily. 5. Most patients will experience some swelling and bruising in the area of the incisions.  Ice packs will help.  Swelling and bruising can take several days to resolve.  6. It is common to experience some constipation if taking pain medication after surgery.  Increasing fluid intake and taking a stool softener (such as Colace) will usually help or prevent this problem from occurring.  A mild laxative (Milk of Magnesia or Miralax) should be taken according to package instructions if there are no bowel movements after 48 hours. 7. Unless discharge instructions indicate otherwise, you may remove your bandages 24-48 hours after surgery, and you may shower at that time.  You may have steri-strips (small skin tapes) in place directly over the incision.  These strips should be left on the skin for 7-10 days.  If your surgeon used skin glue on the incision, you may shower in 24 hours.  The glue will flake off over the next 2-3 weeks.  Any sutures or  staples will be removed at the office during your follow-up visit. 8. ACTIVITIES:  You may resume regular (light) daily activities beginning the next day--such as daily self-care, walking, climbing stairs--gradually increasing activities as tolerated.  You may have sexual intercourse when it is comfortable.  Refrain from any heavy lifting or straining until approved by your doctor. a. You may drive when you are no longer taking prescription pain medication, you can comfortably wear a seatbelt, and you can safely maneuver your car and apply brakes. b. RETURN TO WORK:  __________________________________________________________ 9. You should see your doctor in the office for a follow-up appointment approximately 2-3 weeks after your surgery.  Make sure that you call for this appointment within a day or two after you arrive home to insure a convenient appointment time. 10. OTHER INSTRUCTIONS: ____NO LIFTING MORE THAN 15 POUNDS FOR 3 WEEKS 11. MAY SHOWER STARTING TOMORROW 12. ICE PACK, IBUPROFEN ALSO FOR PAIN 13. MIRALAX FOR CONSTIPATION______________________________________________________________________________________________________________________ __________________________________________________________________________________________________________________________ WHEN TO CALL YOUR DOCTOR: 1. Fever over 101.0 2. Inability to urinate 3. Continued bleeding from incision. 4. Increased pain, redness, or drainage from the incision. 5. Increasing abdominal pain  The clinic staff is available to answer your questions during regular business hours.  Please dont hesitate to call and ask to speak to one of the nurses for clinical concerns.  If you have a medical emergency, go to the nearest emergency room or call 911.  A surgeon from [facility] is always on call at the hospital. [address] [employer]  [address], [place], [place]  [address] ? [address], [place], [place]   [address] [phone] ? [phone] ? FAX [phone] Web site: [email or web address]

## 2014-02-22 NOTE — Transfer of Care (Signed)
Immediate Anesthesia Transfer of Care Note  Patient: Whitney Jenkins  Procedure(s) Performed: Procedure(s): LAPAROSCOPIC CHOLECYSTECTOMY (N/A)  Patient Location: PACU  Anesthesia Type:General  Level of Consciousness: awake, alert  and oriented  Airway & Oxygen Therapy: Patient Spontanous Breathing and Patient connected to nasal cannula oxygen  Post-op Assessment: Report given to PACU RN, Post -op Vital signs reviewed and stable and Patient moving all extremities X 4  Post vital signs: Reviewed and stable  Complications: No apparent anesthesia complications

## 2014-02-22 NOTE — H&P (Signed)
Chief Complaint   Patient presents with   .  eval gallbladder   HPI  Whitney Jenkins is a 38 y.o. female.  HPI  She is referred by Dr. Collene Mares. She has been having nausea and vomiting intermittently for one year. She also has mild to moderate epigastric cramping pain. This only occurs after fatty meals. BM's are fairly normal. She is loosing weight. She is otherwise healthy and without complaints  Past Medical History   Diagnosis  Date   .  Anemia    .  GERD (gastroesophageal reflux disease)    History reviewed. No pertinent past surgical history.  Family History   Problem  Relation  Age of Onset   .  Cancer  Other    .  Hypertension  Other    Social History  History   Substance Use Topics   .  Smoking status:  Current Every Day Smoker -- 0.50 packs/day     Types:  Cigarettes   .  Smokeless tobacco:  Not on file   .  Alcohol Use:  Yes      Comment: occ    Allergies   Allergen  Reactions   .  Percocet [Oxycodone-Acetaminophen]  Nausea And Vomiting    Current Outpatient Prescriptions   Medication  Sig  Dispense  Refill   .  omeprazole (PRILOSEC) 20 MG capsule  Take 20 mg by mouth daily.      No current facility-administered medications for this visit.   Review of Systems  Review of Systems  Constitutional: Negative for fever, chills and unexpected weight change.  HENT: Negative for congestion, hearing loss, sore throat, trouble swallowing and voice change.  Eyes: Negative for visual disturbance.  Respiratory: Negative for cough and wheezing.  Cardiovascular: Negative for chest pain, palpitations and leg swelling.  Gastrointestinal: Positive for nausea, vomiting and abdominal pain. Negative for diarrhea, constipation, blood in stool, abdominal distention and anal bleeding.  Genitourinary: Negative for hematuria, vaginal bleeding and difficulty urinating.  Musculoskeletal: Negative for arthralgias.  Skin: Negative for rash and wound.  Neurological: Negative for seizures, syncope  and headaches.  Hematological: Negative for adenopathy. Does not bruise/bleed easily.  Psychiatric/Behavioral: Negative for confusion.  Blood pressure 130/72, pulse 54, temperature 98.3 F (36.8 C), height 5' 9"  (1.753 m), weight 133 lb (60.328 kg).  Physical Exam  Physical Exam  Constitutional: She is oriented to person, place, and time. She appears well-developed and well-nourished. No distress.  HENT:  Head: Normocephalic and atraumatic.  Right Ear: External ear normal.  Left Ear: External ear normal.  Nose: Nose normal.  Mouth/Throat: Oropharynx is clear and moist.  Eyes: Conjunctivae are normal. Pupils are equal, round, and reactive to light. Right eye exhibits no discharge. Left eye exhibits no discharge. No scleral icterus.  Neck: Normal range of motion. No tracheal deviation present.  Cardiovascular: Normal rate, regular rhythm, normal heart sounds and intact distal pulses.  No murmur heard.  Pulmonary/Chest: Effort normal and breath sounds normal. No respiratory distress. She has no wheezes. She has no rales.  Abdominal: Soft. Bowel sounds are normal. She exhibits no distension. There is no tenderness. There is no guarding.  Mild tenderness with guarding in the epigastrium  Musculoskeletal: Normal range of motion. She exhibits no edema and no tenderness.  Lymphadenopathy:  She has no cervical adenopathy.  Neurological: She is alert and oriented to person, place, and time.  Skin: Skin is warm and dry. No rash noted. She is not diaphoretic. No erythema.  Psychiatric:  Her behavior is normal. Judgment normal.  Data Reviewed  Notes reviewed  Upper endo is negative. U/S shows no gallstones. HIDA scan showed normal EF but pain was reproduced with CCK  Assessment  Chronic cholecystitis  Plan  I discussed this with her in detail. I recommend lap chole. She is eager to proceed. I discussed the procedure in detail. The patient was given Neurosurgeon. We discussed the risks and  benefits of a laparoscopic cholecystectomy and possible cholangiogram including, but not limited to bleeding, infection, injury to surrounding structures such as the intestine or liver, bile leak, retained gallstones, need to convert to an open procedure, prolonged diarrhea, blood clots such as DVT, common bile duct injury, anesthesia risks, and possible need for additional procedures. The likelihood of improvement in symptoms and return to the patient's normal status is good. We discussed the typical post-operative recovery course.

## 2014-02-22 NOTE — Op Note (Signed)
Laparoscopic Cholecystectomy Procedure Note  Indications: This patient presents with symptomatic gallbladder disease and will undergo laparoscopic cholecystectomy.  Pre-operative Diagnosis: Chronic cholecystitis  Post-operative Diagnosis: Same  Surgeon: Harl Bowie   Assistants: 0  Anesthesia: General endotracheal anesthesia  ASA Class: 1  Procedure Details  The patient was seen again in the Holding Room. The risks, benefits, complications, treatment options, and expected outcomes were discussed with the patient. The possibilities of reaction to medication, pulmonary aspiration, perforation of viscus, bleeding, recurrent infection, finding a normal gallbladder, the need for additional procedures, failure to diagnose a condition, the possible need to convert to an open procedure, and creating a complication requiring transfusion or operation were discussed with the patient. The likelihood of improving the patient's symptoms with return to their baseline status is good.  The patient and/or family concurred with the proposed plan, giving informed consent. The site of surgery properly noted. The patient was taken to Operating Room, identified as Whitney Jenkins and the procedure verified as Laparoscopic Cholecystectomy with Intraoperative Cholangiogram. A Time Out was held and the above information confirmed.  Prior to the induction of general anesthesia, antibiotic prophylaxis was administered. General endotracheal anesthesia was then administered and tolerated well. After the induction, the abdomen was prepped with Chloraprep and draped in sterile fashion. The patient was positioned in the supine position.  Local anesthetic agent was injected into the skin near the umbilicus and an incision made. We dissected down to the abdominal fascia with blunt dissection.  The fascia was incised vertically and we entered the peritoneal cavity bluntly.  A pursestring suture of 0-Vicryl was placed around  the fascial opening.  The Hasson cannula was inserted and secured with the stay suture.  Pneumoperitoneum was then created with CO2 and tolerated well without any adverse changes in the patient's vital signs. An 11-mm port was placed in the subxiphoid position.  Two 5-mm ports were placed in the right upper quadrant. All skin incisions were infiltrated with a local anesthetic agent before making the incision and placing the trocars.   We positioned the patient in reverse Trendelenburg, tilted slightly to the patient's left.  The gallbladder was identified, the fundus grasped and retracted cephalad. Adhesions were lysed bluntly and with the electrocautery where indicated, taking care not to injure any adjacent organs or viscus. The infundibulum was grasped and retracted laterally, exposing the peritoneum overlying the triangle of Calot. This was then divided and exposed in a blunt fashion. The cystic duct was clearly identified and bluntly dissected circumferentially. A critical view of the cystic duct and cystic artery was obtained.  The cystic duct was then ligated with clips and divided. The cystic artery was, dissected free, ligated with clips and divided as well.   The gallbladder was dissected from the liver bed in retrograde fashion with the electrocautery. The gallbladder was removed and placed in an Endocatch sac. The liver bed was irrigated and inspected. Hemostasis was achieved with the electrocautery. Copious irrigation was utilized and was repeatedly aspirated until clear.  The gallbladder and Endocatch sac were then removed through the umbilical port site.  The pursestring suture was used to close the umbilical fascia.    We again inspected the right upper quadrant for hemostasis.  Pneumoperitoneum was released as we removed the trocars.  4-0 Monocryl was used to close the skin.   Benzoin, steri-strips, and clean dressings were applied. The patient was then extubated and brought to the recovery  room in stable condition. Instrument, sponge, and needle  counts were correct at closure and at the conclusion of the case.   Findings: Chronic Cholecystitis without Cholelithiasis  Estimated Blood Loss: Minimal         Drains: 0         Specimens: Gallbladder           Complications: None; patient tolerated the procedure well.         Disposition: PACU - hemodynamically stable.         Condition: stable

## 2014-02-23 ENCOUNTER — Encounter (HOSPITAL_COMMUNITY): Payer: Self-pay | Admitting: Surgery

## 2014-03-15 ENCOUNTER — Encounter (INDEPENDENT_AMBULATORY_CARE_PROVIDER_SITE_OTHER): Payer: Self-pay | Admitting: Surgery

## 2014-03-15 ENCOUNTER — Ambulatory Visit (INDEPENDENT_AMBULATORY_CARE_PROVIDER_SITE_OTHER): Payer: BC Managed Care – PPO | Admitting: Surgery

## 2014-03-15 VITALS — BP 110/80 | HR 79 | Temp 97.9°F | Resp 14 | Ht 69.0 in | Wt 135.4 lb

## 2014-03-15 DIAGNOSIS — Z09 Encounter for follow-up examination after completed treatment for conditions other than malignant neoplasm: Secondary | ICD-10-CM

## 2014-03-15 NOTE — Progress Notes (Signed)
Subjective:     Patient ID: Gracynn Rajewski, female   DOB: December 30, 1975, 38 y.o.   MRN: [id number]  HPI She is here for her first postoperative visit status post laparoscopic cholecystectomy performed approximately 3 weeks ago. She feels like her preoperative symptoms are resolving. She has had minimal nausea. Her bowel movements have improved.  Review of Systems     Objective:   Physical Exam On exam, her incisions are well healed. The final pathology showed mild chronic cholecystitis but no other abnormalities of the gallbladder.    Assessment:     Patient stable postop     Plan:     She has already resumed normal activity. She'll see me back as needed

## 2014-03-24 NOTE — OR Nursing (Signed)
Addendum to scope page.

## 2014-11-29 ENCOUNTER — Other Ambulatory Visit: Payer: Self-pay | Admitting: Obstetrics & Gynecology

## 2014-11-29 DIAGNOSIS — N6459 Other signs and symptoms in breast: Secondary | ICD-10-CM

## 2014-12-07 ENCOUNTER — Ambulatory Visit
Admission: RE | Admit: 2014-12-07 | Discharge: 2014-12-07 | Disposition: A | Discharge status: Home / Self Care | Payer: BLUE CROSS/BLUE SHIELD | Source: Ambulatory Visit | Attending: Obstetrics & Gynecology | Admitting: Obstetrics & Gynecology

## 2014-12-07 DIAGNOSIS — N6459 Other signs and symptoms in breast: Secondary | ICD-10-CM

## 2015-01-17 ENCOUNTER — Telehealth: Payer: Self-pay | Admitting: Genetic Counselor

## 2015-01-17 NOTE — Telephone Encounter (Signed)
Called pt and left message to schedule new pt gen counseling appt.   Dx:  Family Hx of Breast Cancer Referring: Dr. Lynnette Caffey

## 2015-02-06 ENCOUNTER — Telehealth: Payer: Self-pay | Admitting: Genetic Counselor

## 2015-02-06 NOTE — Telephone Encounter (Signed)
Asked patient to please bring in a copy of her mother's genetic test result, so that we can test for the [facility].

## 2015-02-07 ENCOUNTER — Encounter: Payer: Self-pay | Admitting: Genetic Counselor

## 2015-02-07 ENCOUNTER — Ambulatory Visit (HOSPITAL_BASED_OUTPATIENT_CLINIC_OR_DEPARTMENT_OTHER): Payer: BLUE CROSS/BLUE SHIELD | Admitting: Genetic Counselor

## 2015-02-07 ENCOUNTER — Other Ambulatory Visit: Payer: BLUE CROSS/BLUE SHIELD

## 2015-02-07 DIAGNOSIS — Z800 Family history of malignant neoplasm of digestive organs: Secondary | ICD-10-CM | POA: Insufficient documentation

## 2015-02-07 DIAGNOSIS — Z803 Family history of malignant neoplasm of breast: Secondary | ICD-10-CM | POA: Diagnosis not present

## 2015-02-07 DIAGNOSIS — Z808 Family history of malignant neoplasm of other organs or systems: Secondary | ICD-10-CM | POA: Diagnosis not present

## 2015-02-07 DIAGNOSIS — Z8481 Family history of carrier of genetic disease: Secondary | ICD-10-CM | POA: Diagnosis not present

## 2015-02-07 NOTE — Progress Notes (Signed)
REFERRING PROVIDER: Linda Hedges, DO [address] [place], [place], [place] [address]  PRIMARY PROVIDER:  MORRIS, MEGAN, DO  PRIMARY REASON FOR VISIT:  1. Family history of BRCA2 gene positive   2. Family history of breast cancer   3. Family history of pancreatic cancer   4. Family history of breast cancer in female      HISTORY OF PRESENT ILLNESS:   Whitney Jenkins, a 39 y.o. female, was seen for a [facility] cancer genetics consultation at the request of Dr. Lynnette Caffey due to a family history of cancer and a known BRCA2 mutation.  Whitney Jenkins presents to clinic today to discuss the possibility of a hereditary predisposition to cancer, genetic testing, and to further clarify her future cancer risks, as well as potential cancer risks for family members. Whitney Jenkins has no personal history of cancer.    CANCER HISTORY:   No history exists.     HORMONAL RISK FACTORS:  Menarche was at age 45.  First live birth at age N/A.  OCP use for approximately 0 years.  Ovaries intact: yes.  Hysterectomy: no.  Menopausal status: premenopausal.  HRT use: 0 years. Colonoscopy: no; not examined. Mammogram within the last year: yes. Number of breast biopsies: 1. Up to date with pelvic exams:  no. Any excessive radiation exposure in the past:  no  Past Medical History  Diagnosis Date  . History of migraine     last one 2wks ago  . GERD (gastroesophageal reflux disease)     takes Omeprazole daily  . Anemia     was on iron pills but states she has been off for a while  . FH: BRCA2 gene positive   . Family history of breast cancer   . Family history of breast cancer in female   . Family history of pancreatic cancer     Past Surgical History  Procedure Laterality Date  . Knee arthroscopy Left   . Cholecystectomy N/A 02/22/2014    Procedure: LAPAROSCOPIC CHOLECYSTECTOMY;  Surgeon: Harl Bowie, MD;  Location: [facility];  Service: General;  Laterality: N/A;    History    Social History  . Marital Status: Single    Spouse Name: N/A  . Number of Children: N/A  . Years of Education: N/A   Social History Main Topics  . Smoking status: Current Every Day Smoker -- 0.25 packs/day for 12 years    Types: Cigarettes  . Smokeless tobacco: Not on file  . Alcohol Use: No  . Drug Use: No  . Sexual Activity: Not Currently   Other Topics Concern  . None   Social History Narrative     FAMILY HISTORY:  We obtained a detailed, 4-generation family history.  Significant diagnoses are listed below: Family History  Problem Relation Age of Onset  . Breast cancer Mother 56    recurrance at 3  . BRCA 1/2 Mother     BRCA2 +  . BRCA 1/2 Sister     BRCA2+  . Breast cancer Maternal Aunt   . Colon cancer Maternal Aunt   . Breast cancer Maternal Uncle   . Lung cancer Paternal Uncle   . Lung cancer Maternal Grandmother     smoker  . Lung cancer Maternal Grandfather     smoker  . Pancreatic cancer Maternal Uncle   . Stomach cancer Maternal Uncle    The patient has a full brother, maternal half sister and paternal half sister.  Her mother was diagnosed with breast cancer at age 65 and again at 76.  She tested postivie for a BRCA2 mutation, and her sister has also.  Her mother had 8 brothers and 3 sisters.  One sister had both breast and colon cancer, a brother had breast cancer, another brother had stomach cancer and a third brother had pancreatic cancer.  There are no other contributory cancers in the family. Patient's maternal ancestors are of [place] American descent, and paternal ancestors are of African [place] descent. There is no reported Ashkenazi Jewish ancestry. There is no known consanguinity.  GENETIC COUNSELING ASSESSMENT: Whitney Jenkins is a 39 y.o. female with a family history of cancer and a known BRCA2 mutation which somewhat suggestive of a hereditary breast and ovarian cancer syndrome and predisposition to cancer. We, therefore, discussed and  recommended the following at today's visit.   DISCUSSION: We reviewed the characteristics, features and inheritance patterns of hereditary cancer syndromes. Whitney Jenkins brought in a copy of her sister's genetic test that indicates she has a BRCA pathogenic mutation and a PALB2 VUS.  Based on her family history, Whitney Jenkins is at 50% risk for inheriting the BRCA2 mutation.  We reviewed management changes should she test positive for this mutation, and that we would not offer increased screening if she were negative.  Based on that the PALB2 variant is uncertain, we will not offer genetic testing on that change.  We also discussed genetic testing, including the appropriate family members to test, the process of testing, insurance coverage and turn-around-time for results. We discussed the implications of a negative, positive and/or variant of uncertain significant result. We recommended Whitney Jenkins pursue genetic testing for the targeted BRCA2 gene looking for the familial mutation called [id number].   PLAN: After considering the risks, benefits, and limitations, Whitney Jenkins  provided informed consent to pursue genetic testing and the blood sample was sent to [facility] for analysis of the BRCA2 targeted mutation. Results should be available within approximately 7-10 days' time, at which point they will be disclosed by telephone to Whitney Jenkins, as will any additional recommendations warranted by these results. Whitney Jenkins will receive a summary of her genetic counseling visit and a copy of her results once available. This information will also be available in Epic. We encouraged Whitney Jenkins to remain in contact with cancer genetics annually so that we can continuously update the family history and inform her of any changes in cancer genetics and testing that may be of benefit for her family. Whitney Jenkins's questions were answered to her satisfaction today. Our contact information was provided should additional  questions or concerns arise.  Lastly, we encouraged Whitney Jenkins to remain in contact with cancer genetics annually so that we can continuously update the family history and inform her of any changes in cancer genetics and testing that may be of benefit for this family.   Ms.  Jenkins's questions were answered to her satisfaction today. Our contact information was provided should additional questions or concerns arise. Thank you for the referral and allowing [place] to share in the care of your patient.   Karen P. Florene Glen, [place], [facility] Certified Genetic Counselor [email or web address] phone: [phone]  The patient was seen for a total of 60 minutes in face-to-face genetic counseling.  This patient was discussed with Drs. Magrinat, Lindi Adie and/or Burr Medico who agrees with the above.    _______________________________________________________________________ For Office Staff:  Number of people involved in session: 2 Was an Intern/ student involved  with case: no

## 2015-02-21 ENCOUNTER — Encounter: Payer: Self-pay | Admitting: Genetic Counselor

## 2015-02-21 NOTE — Progress Notes (Signed)
Whitney Quail, MS, CGC documenting for Whitney Luz, MS (EPIC access pending):  Positive genetic test results were delivered to Whitney Jenkins by phone on Feb 19, 2015.   Whitney Jenkins recently had single-site BRCA2 testing through [facility] to determine whether or not she inherited the [id number] ([id number]) mutation from her mother.  Whitney Jenkins tested positive for this known familial mutation. Having a BRCA2 mutation means that Whitney Jenkins is at an increased risk for breast cancer, so we discussed changes to her medical management based on [employer]  (NCCN) guidelines.  At the age of 39, Whitney Jenkins should become familiar with her normal breast tissue--self breast exams can be helpful for regular assessment.  She should also be receiving clinical breast exams once to twice a year and having both mammogram and breast MRI screening annually.  NCCN guidelines recommend having a discussion with her doctor about risk-reducing mastectomy.  We further discussed the increased risk for ovarian cancer.  Ovarian cancer screening options are currently not as reliable as screening options for breast cancer.  Risk-reducing salpingo-oophorectomy, or removal of the fallopian tubes and ovaries, is recommended between 79 and 77 years of age, after child-bearing is complete, or between 41 and 54 years of age with previous bilateral mastectomy.  In addition, individuals who are BRCA2-positive are at an increased risk for pancreatic and melanoma cancers.  Skin checks and pancreatic cancer screening options should be considered, especially considering the family history of pancreatic cancer.    Whitney Jenkins will be sent a copy of the note from her genetic counseling visit, as well as a copy of the genetic test results.  We will also send a copy of her results to her primary care physician, Dr. Linda Hedges, and to Dr. Lindi Adie.  Whitney Jenkins expressed her interest in returning for a follow-up genetic  counseling appointment to discuss these results in more detail.  She will call to schedule that appointment at a later date.  She was also encouraged to call our offices with any questions she might have.  Whitney Luz, MS Roma Kayser, MS, CGC

## 2015-02-22 ENCOUNTER — Encounter: Payer: Self-pay | Admitting: Genetic Counselor

## 2015-02-22 DIAGNOSIS — Z1501 Genetic susceptibility to malignant neoplasm of breast: Secondary | ICD-10-CM | POA: Insufficient documentation

## 2015-02-22 DIAGNOSIS — Z1379 Encounter for other screening for genetic and chromosomal anomalies: Secondary | ICD-10-CM | POA: Insufficient documentation

## 2015-02-22 DIAGNOSIS — Z1509 Genetic susceptibility to other malignant neoplasm: Secondary | ICD-10-CM

## 2015-05-11 ENCOUNTER — Other Ambulatory Visit: Payer: Self-pay | Admitting: Obstetrics & Gynecology

## 2015-05-11 LAB — HM PAP SMEAR: HM PAP: NEGATIVE

## 2015-05-14 LAB — CYTOLOGY - PAP

## 2015-05-23 ENCOUNTER — Other Ambulatory Visit: Payer: Self-pay | Admitting: Obstetrics & Gynecology

## 2015-05-23 DIAGNOSIS — Z803 Family history of malignant neoplasm of breast: Secondary | ICD-10-CM

## 2015-06-06 ENCOUNTER — Other Ambulatory Visit: Payer: BLUE CROSS/BLUE SHIELD

## 2015-11-06 DIAGNOSIS — D259 Leiomyoma of uterus, unspecified: Secondary | ICD-10-CM | POA: Insufficient documentation

## 2015-11-30 ENCOUNTER — Other Ambulatory Visit: Payer: Self-pay

## 2015-11-30 DIAGNOSIS — Z1231 Encounter for screening mammogram for malignant neoplasm of breast: Secondary | ICD-10-CM

## 2015-12-18 ENCOUNTER — Ambulatory Visit
Admission: RE | Admit: 2015-12-18 | Discharge: 2015-12-18 | Disposition: A | Discharge status: Home / Self Care | Payer: BLUE CROSS/BLUE SHIELD | Source: Ambulatory Visit

## 2015-12-18 DIAGNOSIS — Z1231 Encounter for screening mammogram for malignant neoplasm of breast: Secondary | ICD-10-CM

## 2015-12-18 LAB — HM MAMMOGRAPHY

## 2016-03-20 DIAGNOSIS — Z800 Family history of malignant neoplasm of digestive organs: Secondary | ICD-10-CM | POA: Diagnosis not present

## 2016-03-20 DIAGNOSIS — Z1211 Encounter for screening for malignant neoplasm of colon: Secondary | ICD-10-CM | POA: Diagnosis not present

## 2016-03-20 DIAGNOSIS — K625 Hemorrhage of anus and rectum: Secondary | ICD-10-CM | POA: Diagnosis not present

## 2016-03-20 DIAGNOSIS — R194 Change in bowel habit: Secondary | ICD-10-CM | POA: Diagnosis not present

## 2016-03-25 DIAGNOSIS — K529 Noninfective gastroenteritis and colitis, unspecified: Secondary | ICD-10-CM | POA: Diagnosis not present

## 2016-03-25 DIAGNOSIS — Z1211 Encounter for screening for malignant neoplasm of colon: Secondary | ICD-10-CM | POA: Diagnosis not present

## 2016-03-25 DIAGNOSIS — K921 Melena: Secondary | ICD-10-CM | POA: Diagnosis not present

## 2016-03-25 DIAGNOSIS — K5190 Ulcerative colitis, unspecified, without complications: Secondary | ICD-10-CM | POA: Diagnosis not present

## 2016-03-25 DIAGNOSIS — K625 Hemorrhage of anus and rectum: Secondary | ICD-10-CM | POA: Diagnosis not present

## 2016-04-24 DIAGNOSIS — Z800 Family history of malignant neoplasm of digestive organs: Secondary | ICD-10-CM | POA: Diagnosis not present

## 2016-04-24 DIAGNOSIS — K5900 Constipation, unspecified: Secondary | ICD-10-CM | POA: Diagnosis not present

## 2016-04-24 DIAGNOSIS — K5190 Ulcerative colitis, unspecified, without complications: Secondary | ICD-10-CM | POA: Diagnosis not present

## 2016-10-09 ENCOUNTER — Encounter: Payer: Self-pay | Admitting: Family Medicine

## 2016-10-09 ENCOUNTER — Ambulatory Visit (INDEPENDENT_AMBULATORY_CARE_PROVIDER_SITE_OTHER): Payer: BLUE CROSS/BLUE SHIELD | Admitting: Family Medicine

## 2016-10-09 VITALS — BP 118/78 | HR 66 | Temp 98.4°F | Wt 140.8 lb

## 2016-10-09 DIAGNOSIS — G44209 Tension-type headache, unspecified, not intractable: Secondary | ICD-10-CM | POA: Diagnosis not present

## 2016-10-09 DIAGNOSIS — Z23 Encounter for immunization: Secondary | ICD-10-CM | POA: Diagnosis not present

## 2016-10-09 DIAGNOSIS — R21 Rash and other nonspecific skin eruption: Secondary | ICD-10-CM | POA: Diagnosis not present

## 2016-10-09 DIAGNOSIS — Z7689 Persons encountering health services in other specified circumstances: Secondary | ICD-10-CM

## 2016-10-09 NOTE — Progress Notes (Signed)
   Subjective:    Patient ID: Whitney Jenkins, female    DOB: 1976-07-05, 41 y.o.   MRN: [id number]  HPI Chief Complaint  Patient presents with  . rash    rash 4 days ago on left side. itches-    She is new to the practice and here for an acute complaint.  Complains of a 4 day history of rash on her left breast that is pruritic. States she did change her laundry detergent recently and this may be related. States she does not like to use lotion and her skin is dry.  Denies rash anywhere else. No fever, chills, unexplained weight loss, fatigue, chest pain, palpitations, cough, GI or GU symptoms.  States she has breast cancer gene and is followed closely at the breast center.   Also complains of frontal headache for the past week that is non radiating and feels like a dull ache. States she is overdue for an eye exam. She wears eyeglasses. States she has been squinting a lot. States pain is relieved with excedrin and also by removing her glasses. Denies photophobia, phonophobia, blurred or double vision.  Denies URI symptoms.   Other providers: Dr. Collene Mares is GI. [facility] Dr. Lynnette Caffey OB/GYN   PMH: ulcerative colitis  Denies smoking, alcohol or drug use.   Lives with parents. Manager at [employer].   LMP: 09/30/2015 Contraception: not sexually active.   Would like flu shot.   Reviewed allergies, medications, past medical, surgical, family, and social history.     Review of Systems Pertinent positives and negatives in the history of present illness.     Objective:   Physical Exam BP 118/78   Pulse 66   Temp 98.4 F (36.9 C) (Oral)   Wt 140 lb 12.8 oz (63.9 kg)   SpO2 98%   BMI 20.79 kg/m   Alert and in no distress. No sinus tenderness, nares normal. Tympanic membranes and canals are normal. Pharyngeal area is normal. Neck is supple without adenopathy or thyromegaly. Left breast with nonspecific rash and scab to 10 o'clock area, no erythema, induration, or nodule. No  asymmetry noted. Nipple is normal. Bilateral breast exam normal otherwise.       Assessment & Plan:  Rash and nonspecific skin eruption  Tension headache  Need for prophylactic vaccination and inoculation against influenza  Encounter to establish care  Discussed that the rash on her breast is nonspecific and does not look worrisome. This may be due to recent detergent change. Advised her to use lotion as her skin is dry. She may also try hydrocortisone cream to the area. She will call if it worsens.  She will continue to get annual mammograms since she has BRCA2 gene.  Discussed management of tension type headache and she will let me know if this worsens.  Flu shot given.  Will attempt to get copy of labs and office notes from her OB/GYN.  She will return for a CPE and fasting labs as needed.

## 2016-10-09 NOTE — Patient Instructions (Addendum)
Use lotion to the area and if needed, you can try hydrocortisone. If the rash gets worse call.   For your headache, stay hydrated, get regular sleep and get your eyes re-examined since it is time for this. If they get worse call me.   Return for an annual exam and fasting labs as needed.    Tension Headache A tension headache is a feeling of pain, pressure, or aching that is often felt over the front and sides of the head. The pain can be dull, or it can feel tight (constricting). Tension headaches are not normally associated with nausea or vomiting, and they do not get worse with physical activity. Tension headaches can last from 30 minutes to several days. This is the most common type of headache. CAUSES The exact cause of this condition is not known. Tension headaches often begin after stress, anxiety, or depression. Other triggers may include:  Alcohol.  Too much caffeine, or caffeine withdrawal.  Respiratory infections, such as colds, flu, or sinus infections.  Dental problems or teeth clenching.  Fatigue.  Holding your head and neck in the same position for a long period of time, such as while using a computer.  Smoking. SYMPTOMS Symptoms of this condition include:  A feeling of pressure around the head.  Dull, aching head pain.  Pain felt over the front and sides of the head.  Tenderness in the muscles of the head, neck, and shoulders. DIAGNOSIS This condition may be diagnosed based on your symptoms and a physical exam. Tests may be done, such as a CT scan or an MRI of your head. These tests may be done if your symptoms are severe or unusual. TREATMENT This condition may be treated with lifestyle changes and medicines to help relieve symptoms. HOME CARE INSTRUCTIONS Managing Pain   Take over-the-counter and prescription medicines only as told by your health care provider.  Lie down in a dark, quiet room when you have a headache.  If directed, apply ice to the head  and neck area:  Put ice in a plastic bag.  Place a towel between your skin and the bag.  Leave the ice on for 20 minutes, 2-3 times per day.  Use a heating pad or a hot shower to apply heat to the head and neck area as told by your health care provider. Eating and Drinking   Eat meals on a regular schedule.  Limit alcohol use.  Decrease your caffeine intake, or stop using caffeine. General Instructions   Keep all follow-up visits as told by your health care provider. This is important.  Keep a headache journal to help find out what may trigger your headaches. For example, write down:  What you eat and drink.  How much sleep you get.  Any change to your diet or medicines.  Try massage or other relaxation techniques.  Limit stress.  Sit up straight, and avoid tensing your muscles.  Do not use tobacco products, including cigarettes, chewing tobacco, or e-cigarettes. If you need help quitting, ask your health care provider.  Exercise regularly as told by your health care provider.  Get 7-9 hours of sleep, or the amount recommended by your health care provider. SEEK MEDICAL CARE IF:  Your symptoms are not helped by medicine.  You have a headache that is different from what you normally experience.  You have nausea or you vomit.  You have a fever. SEEK IMMEDIATE MEDICAL CARE IF:  Your headache becomes severe.  You have repeated  vomiting.  You have a stiff neck.  You have a loss of vision.  You have problems with speech.  You have pain in your eye or ear.  You have muscular weakness or loss of muscle control.  You lose your balance or you have trouble walking.  You feel faint or you pass out.  You have confusion. This information is not intended to replace advice given to you by your health care provider. Make sure you discuss any questions you have with your health care provider. Document Released: 09/15/2005 Document Revised: 06/06/2015 Document  Reviewed: 01/08/2015 Elsevier Interactive Patient Education  2017 [employer].

## 2016-10-09 NOTE — Addendum Note (Signed)
Addended by: Minette Headland A on: 10/09/2016 05:20 PM   Modules accepted: Orders

## 2016-10-22 ENCOUNTER — Telehealth: Payer: Self-pay | Admitting: Family Medicine

## 2016-10-22 NOTE — Telephone Encounter (Signed)
Recvd office notes, labs from [facility]

## 2016-10-30 ENCOUNTER — Emergency Department (HOSPITAL_COMMUNITY): Payer: BLUE CROSS/BLUE SHIELD

## 2016-10-30 ENCOUNTER — Encounter (HOSPITAL_COMMUNITY): Payer: Self-pay

## 2016-10-30 ENCOUNTER — Emergency Department (HOSPITAL_COMMUNITY)
Admission: EM | Admit: 2016-10-30 | Discharge: 2016-10-30 | Disposition: A | Discharge status: Home / Self Care | Payer: BLUE CROSS/BLUE SHIELD | Attending: Emergency Medicine | Admitting: Emergency Medicine

## 2016-10-30 DIAGNOSIS — R0789 Other chest pain: Secondary | ICD-10-CM | POA: Diagnosis not present

## 2016-10-30 DIAGNOSIS — Z87891 Personal history of nicotine dependence: Secondary | ICD-10-CM | POA: Insufficient documentation

## 2016-10-30 DIAGNOSIS — N9489 Other specified conditions associated with female genital organs and menstrual cycle: Secondary | ICD-10-CM | POA: Insufficient documentation

## 2016-10-30 DIAGNOSIS — Z79899 Other long term (current) drug therapy: Secondary | ICD-10-CM | POA: Insufficient documentation

## 2016-10-30 DIAGNOSIS — R0602 Shortness of breath: Secondary | ICD-10-CM | POA: Diagnosis not present

## 2016-10-30 DIAGNOSIS — R079 Chest pain, unspecified: Secondary | ICD-10-CM | POA: Diagnosis not present

## 2016-10-30 LAB — BASIC METABOLIC PANEL
Anion gap: 10 (ref 5–15)
BUN: 8 mg/dL (ref 6–20)
CHLORIDE: 106 mmol/L (ref 101–111)
CO2: 22 mmol/L (ref 22–32)
Calcium: 8.9 mg/dL (ref 8.9–10.3)
Creatinine, Ser: 0.69 mg/dL (ref 0.44–1.00)
GFR calc Af Amer: >60 mL/min (ref >60)
GLUCOSE: 103 mg/dL — AB (ref 65–99)
POTASSIUM: 3.5 mmol/L (ref 3.5–5.1)
Sodium: 138 mmol/L (ref 135–145)

## 2016-10-30 LAB — CBC
HEMATOCRIT: 40.3 % (ref 36.0–46.0)
Hemoglobin: 13.2 g/dL (ref 12.0–15.0)
MCH: 30.2 pg (ref 26.0–34.0)
MCHC: 32.8 g/dL (ref 30.0–36.0)
MCV: 92.2 fL (ref 78.0–100.0)
Platelets: 230 10*3/uL (ref 150–400)
RBC: 4.37 MIL/uL (ref 3.87–5.11)
RDW: 11.3 % — AB (ref 11.5–15.5)
WBC: 5.1 10*3/uL (ref 4.0–10.5)

## 2016-10-30 LAB — HCG, QUANTITATIVE, PREGNANCY: hCG, Beta Chain, Quant, S: <1 m[IU]/mL (ref <5)

## 2016-10-30 LAB — TROPONIN I: Troponin I: <0.03 ng/mL (ref <0.03)

## 2016-10-30 MED ORDER — TRAMADOL HCL 50 MG PO TABS: 50.0000 mg | ORAL_TABLET | Freq: Four times a day (QID) | ORAL | 0 refills | Status: DC | PRN

## 2016-10-30 MED ORDER — IBUPROFEN 800 MG PO TABS: 800.0000 mg | ORAL_TABLET | Freq: Three times a day (TID) | ORAL | 0 refills | Status: DC

## 2016-10-30 NOTE — ED Provider Notes (Signed)
[facility] DEPT Provider Note   CSN: [id number] Arrival date & time: 10/30/16  2117   By signing my name below, I, Delton Prairie, attest that this documentation has been prepared under the direction and in the presence of Orpah Greek, MD  Electronically Signed: Delton Prairie, ED Scribe. 10/30/16. 11:19 PM.   History   Chief Complaint Chief Complaint  Patient presents with  . Chest Pain  . Dizziness   The history is provided by the patient. No language interpreter was used.   HPI Comments:  Whitney Jenkins is a 41 y.o. female who presents to the Emergency Department complaining of acute onset, intermittent chest pain which began around 7:30 PM today. Pt describes her pain as a dull, pressurized and aching pain which radiates to under her left breast and side. Her pain is worse when talking. She also reports a cough and congestion. No alleviating factors noted. No other associated symptoms noted.   Past Medical History:  Diagnosis Date  . Anemia    was on iron pills but states she has been off for a while  . Family history of breast cancer   . Family history of breast cancer in female   . Family history of pancreatic cancer   . FH: BRCA2 gene positive   . GERD (gastroesophageal reflux disease)    takes Omeprazole daily  . History of migraine    last one 2wks ago    Patient Active Problem List   Diagnosis Date Noted  . Genetic testing 02/22/2015  . BRCA2 positive 02/22/2015  . FH: BRCA2 gene positive   . Family history of breast cancer   . Family history of breast cancer in female   . Family history of pancreatic cancer   . Chronic cholecystitis 02/17/2014    Past Surgical History:  Procedure Laterality Date  . CHOLECYSTECTOMY N/A 02/22/2014   Procedure: LAPAROSCOPIC CHOLECYSTECTOMY;  Surgeon: Harl Bowie, MD;  Location: [facility];  Service: General;  Laterality: N/A;  . KNEE ARTHROSCOPY Left     OB History    No data available       Home Medications      Prior to Admission medications   Medication Sig Start Date End Date Taking? Authorizing Provider  ibuprofen (ADVIL,MOTRIN) 800 MG tablet Take 1 tablet (800 mg total) by mouth 3 (three) times daily. 10/30/16   Orpah Greek, MD  mesalamine (LIALDA) 1.2 g EC tablet Take by mouth daily with breakfast.    Historical Provider, MD  traMADol (ULTRAM) 50 MG tablet Take 1 tablet (50 mg total) by mouth every 6 (six) hours as needed. 10/30/16   Orpah Greek, MD    Family History Family History  Problem Relation Age of Onset  . Breast cancer Mother 95    recurrance at 60  . BRCA 1/2 Mother     BRCA2 +  . BRCA 1/2 Sister     BRCA2+  . Breast cancer Maternal Aunt   . Colon cancer Maternal Aunt   . Breast cancer Maternal Uncle   . Lung cancer Paternal Uncle   . Lung cancer Maternal Grandmother     smoker  . Lung cancer Maternal Grandfather     smoker  . Pancreatic cancer Maternal Uncle   . Stomach cancer Maternal Uncle     Social History Social History  Substance Use Topics  . Smoking status: Former Smoker    Packs/day: 0.25    Years: 12.00    Types: Cigarettes  Quit date: 11/05/2015  . Smokeless tobacco: Never Used  . Alcohol use No     Allergies   Percocet [oxycodone-acetaminophen]   Review of Systems Review of Systems  HENT: Positive for congestion.   Respiratory: Positive for cough.   Cardiovascular: Positive for chest pain.  All other systems reviewed and are negative.  Physical Exam Updated Vital Signs BP 101/67 (BP Location: Right Arm)   Pulse (!) 50   Temp 98.4 F (36.9 C) (Oral)   Resp 16   Ht 5' 9"  (1.753 m)   Wt 135 lb (61.2 kg)   LMP 10/26/2016 (Exact Date)   SpO2 100%   BMI 19.94 kg/m   Physical Exam  Constitutional: She is oriented to person, place, and time. She appears well-developed and well-nourished. No distress.  HENT:  Head: Normocephalic and atraumatic.  Right Ear: Hearing normal.  Left Ear: Hearing normal.  Nose: Nose  normal.  Mouth/Throat: Oropharynx is clear and moist and mucous membranes are normal.  Eyes: Conjunctivae and EOM are normal. Pupils are equal, round, and reactive to light.  Neck: Normal range of motion. Neck supple.  Cardiovascular: Regular rhythm, S1 normal and S2 normal.  Exam reveals no gallop and no friction rub.   No murmur heard. Pulmonary/Chest: Effort normal and breath sounds normal. No respiratory distress. She exhibits tenderness.  Diffuse chest wall tenderness  Abdominal: Soft. Normal appearance and bowel sounds are normal. There is no hepatosplenomegaly. There is no tenderness. There is no rebound, no guarding, no tenderness at McBurney's point and negative Murphy's sign. No hernia.  Musculoskeletal: Normal range of motion.  Neurological: She is alert and oriented to person, place, and time. She has normal strength. No cranial nerve deficit or sensory deficit. Coordination normal. GCS eye subscore is 4. GCS verbal subscore is 5. GCS motor subscore is 6.  Skin: Skin is warm, dry and intact. No rash noted. No cyanosis.  Psychiatric: She has a normal mood and affect. Her speech is normal and behavior is normal. Thought content normal.  Nursing note and vitals reviewed.    ED Treatments / Results  DIAGNOSTIC STUDIES:  Oxygen Saturation is 100% on RA, normal by my interpretation.    COORDINATION OF CARE:  11:38 PM Discussed treatment plan with pt at bedside and pt agreed to plan.  Labs (all labs ordered are listed, but only abnormal results are displayed) Labs Reviewed  BASIC METABOLIC PANEL - Abnormal; Notable for the following:       Result Value   Glucose, Bld 103 (*)    All other components within normal limits  CBC - Abnormal; Notable for the following:    RDW 11.3 (*)    All other components within normal limits  TROPONIN I  HCG, QUANTITATIVE, PREGNANCY    EKG  EKG Interpretation  Date/Time:  Thursday October 30 2016 21:28:06 EST Ventricular Rate:  72 PR  Interval:  148 QRS Duration: 84 QT Interval:  376 QTC Calculation: 411 R Axis:   90 Text Interpretation:  Normal sinus rhythm Normal ECG Confirmed by Kyian Obst  MD, Delinda Malan [phone]) on 10/30/2016 11:16:28 PM       Radiology Dg Chest 2 View  Result Date: 10/30/2016 CLINICAL DATA:  Central chest pain, shortness of breath and nausea. EXAM: CHEST  2 VIEW COMPARISON:  07/07/2013 FINDINGS: The cardiomediastinal contours are normal. The lungs are clear. Pulmonary vasculature is normal. No consolidation, pleural effusion, or pneumothorax. No acute osseous abnormalities are seen. IMPRESSION: No acute pulmonary process. Electronically Signed  By: Jeb Levering M.D.   On: 10/30/2016 22:05    Procedures Procedures (including critical care time)  Medications Ordered in ED Medications - No data to display   Initial Impression / Assessment and Plan / ED Course  I have reviewed the triage vital signs and the nursing notes.  Pertinent labs & imaging results that were available during my care of the patient were reviewed by me and considered in my medical decision making (see chart for details).    Patient presents with complaints of chest pain. Patient having intermittent pain in the center of her chest on and off for the last 5 hours. Patient reports symptoms began while at rest. No shortness of breath noted. She does not have any cardiac risk factors. Pain is very reproducible with palpation. Patient felt to be very low risk for cardiac etiology. Heart score is 0. She also is PERC negative. Will treat with analgesia and rest.  HEART Pathway for Early Discharge in Acute Chest Pain from [email or web address]  on 10/30/2016 ** All calculations should be rechecked by clinician prior to use **  RESULT SUMMARY: 0 points HEART Pathway Score  Low risk 0.9-1.7% 30-day MACE  Repeat troponin at 3 hours and if negative, discharge home with outpatient follow-up.   INPUTS: History -> 0 = Slightly  suspicious EKG -> 0 = Normal Age -> 0 = <45 Risk factors -> 0 = No known risk factors Initial troponin -> 0 = =normal limit  PERC Rule for Pulmonary Embolism from [email or web address]  on 10/30/2016 ** All calculations should be rechecked by clinician prior to use **  RESULT SUMMARY: 0 criteria No need for further workup, as <2% chance of PE.  If no criteria are positive and clinician's pre-test probability is <15%, PERC Rule criteria are satisfied.   INPUTS: Age =50 -> 0 = No HR =100 -> 0 = No SaO2 on room air <95% -> 0 = No Unilateral leg swelling -> 0 = No Hemoptysis -> 0 = No Recent surgery or trauma -> 0 = No Prior PE or DVT -> 0 = No Hormone use -> 0 = No   Final Clinical Impressions(s) / ED Diagnoses   Final diagnoses:  Chest wall pain    New Prescriptions New Prescriptions   IBUPROFEN (ADVIL,MOTRIN) 800 MG TABLET    Take 1 tablet (800 mg total) by mouth 3 (three) times daily.   TRAMADOL (ULTRAM) 50 MG TABLET    Take 1 tablet (50 mg total) by mouth every 6 (six) hours as needed.  I personally performed the services described in this documentation, which was scribed in my presence. The recorded information has been reviewed and is accurate.     Orpah Greek, MD 10/30/16 [phone]

## 2016-10-30 NOTE — ED Triage Notes (Signed)
Pt from home with complaint of central chest pain while sitting with radiation to the back shoulder blades along with nausea and dizziness. VSS.

## 2016-10-30 NOTE — ED Notes (Signed)
ED Provider at bedside. 

## 2016-11-28 DIAGNOSIS — E288 Other ovarian dysfunction: Secondary | ICD-10-CM | POA: Diagnosis not present

## 2017-01-20 ENCOUNTER — Encounter: Payer: Self-pay | Admitting: Internal Medicine

## 2017-01-22 DIAGNOSIS — Z1231 Encounter for screening mammogram for malignant neoplasm of breast: Secondary | ICD-10-CM | POA: Diagnosis not present

## 2017-01-29 ENCOUNTER — Other Ambulatory Visit: Payer: Self-pay | Admitting: Obstetrics & Gynecology

## 2017-01-29 DIAGNOSIS — Z803 Family history of malignant neoplasm of breast: Secondary | ICD-10-CM

## 2017-02-27 DIAGNOSIS — Z6820 Body mass index (BMI) 20.0-20.9, adult: Secondary | ICD-10-CM | POA: Diagnosis not present

## 2017-02-27 DIAGNOSIS — Z01419 Encounter for gynecological examination (general) (routine) without abnormal findings: Secondary | ICD-10-CM | POA: Diagnosis not present

## 2017-03-04 ENCOUNTER — Emergency Department (HOSPITAL_COMMUNITY)
Admission: EM | Admit: 2017-03-04 | Discharge: 2017-03-05 | Disposition: A | Discharge status: Home / Self Care | Payer: BLUE CROSS/BLUE SHIELD | Attending: Emergency Medicine | Admitting: Emergency Medicine

## 2017-03-04 ENCOUNTER — Encounter (HOSPITAL_COMMUNITY): Payer: Self-pay

## 2017-03-04 DIAGNOSIS — Y92511 Restaurant or cafe as the place of occurrence of the external cause: Secondary | ICD-10-CM | POA: Insufficient documentation

## 2017-03-04 DIAGNOSIS — S61205A Unspecified open wound of left ring finger without damage to nail, initial encounter: Secondary | ICD-10-CM | POA: Diagnosis not present

## 2017-03-04 DIAGNOSIS — S61215A Laceration without foreign body of left ring finger without damage to nail, initial encounter: Secondary | ICD-10-CM | POA: Diagnosis not present

## 2017-03-04 DIAGNOSIS — Y939 Activity, unspecified: Secondary | ICD-10-CM | POA: Diagnosis not present

## 2017-03-04 DIAGNOSIS — Z87891 Personal history of nicotine dependence: Secondary | ICD-10-CM | POA: Diagnosis not present

## 2017-03-04 DIAGNOSIS — S61217A Laceration without foreign body of left little finger without damage to nail, initial encounter: Secondary | ICD-10-CM | POA: Insufficient documentation

## 2017-03-04 DIAGNOSIS — S6992XA Unspecified injury of left wrist, hand and finger(s), initial encounter: Secondary | ICD-10-CM | POA: Diagnosis present

## 2017-03-04 DIAGNOSIS — W268XXA Contact with other sharp object(s), not elsewhere classified, initial encounter: Secondary | ICD-10-CM | POA: Diagnosis not present

## 2017-03-04 DIAGNOSIS — Z79899 Other long term (current) drug therapy: Secondary | ICD-10-CM | POA: Insufficient documentation

## 2017-03-04 DIAGNOSIS — S61209A Unspecified open wound of unspecified finger without damage to nail, initial encounter: Secondary | ICD-10-CM

## 2017-03-04 DIAGNOSIS — W290XXA Contact with powered kitchen appliance, initial encounter: Secondary | ICD-10-CM | POA: Insufficient documentation

## 2017-03-04 DIAGNOSIS — Y990 Civilian activity done for income or pay: Secondary | ICD-10-CM | POA: Insufficient documentation

## 2017-03-04 MED ORDER — BACITRACIN ZINC 500 UNIT/GM EX OINT: 1.0000 "application " | TOPICAL_OINTMENT | Freq: Two times a day (BID) | CUTANEOUS | 1 refills | Status: DC

## 2017-03-04 NOTE — ED Triage Notes (Signed)
Pt cut two fingers on the slicer at work

## 2017-03-05 NOTE — ED Provider Notes (Signed)
[facility] DEPT Provider Note   CSN: [id number] Arrival date & time: 03/04/17  2153     History   Chief Complaint Chief Complaint  Patient presents with  . Extremity Laceration    HPI Whitney Jenkins is a 41 y.o. female.  Whitney Jenkins is a 41 y.o. Female who presents to the emergency department complaining of a laceration to her left fourth and fifth fingertips sustained while working today. 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 Fingertips are wrapped prior to arrival. No pain medication prior to arrival. She denies other injury. Last tetanus shot was in 2015.   The history is provided by the patient and medical records. No language interpreter was used.    Past Medical History:  Diagnosis Date  . Anemia    was on iron pills but states she has been off for a while  . Family history of breast cancer   . Family history of breast cancer in female   . Family history of pancreatic cancer   . FH: BRCA2 gene positive   . GERD (gastroesophageal reflux disease)    takes Omeprazole daily  . History of migraine    last one 2wks ago    Patient Active Problem List   Diagnosis Date Noted  . Genetic testing 02/22/2015  . BRCA2 positive 02/22/2015  . FH: BRCA2 gene positive   . Family history of breast cancer   . Family history of breast cancer in female   . Family history of pancreatic cancer   . Chronic cholecystitis 02/17/2014    Past Surgical History:  Procedure Laterality Date  . CHOLECYSTECTOMY N/A 02/22/2014   Procedure: LAPAROSCOPIC CHOLECYSTECTOMY;  Surgeon: Harl Bowie, MD;  Location: [facility];  Service: General;  Laterality: N/A;  . KNEE ARTHROSCOPY Left     OB History    No data available       Home Medications    Prior to Admission medications   Medication Sig Start Date End Date Taking? Authorizing Provider  bacitracin ointment Apply 1 application topically 2 (two) times daily. 03/04/17    Waynetta Pean, PA-C  ibuprofen (ADVIL,MOTRIN) 800 MG tablet Take 1 tablet (800 mg total) by mouth 3 (three) times daily. 10/30/16   Orpah Greek, MD  mesalamine (LIALDA) 1.2 g EC tablet Take by mouth daily with breakfast.    [provider]  traMADol (ULTRAM) 50 MG tablet Take 1 tablet (50 mg total) by mouth every 6 (six) hours as needed. 10/30/16   Orpah Greek, MD    Family History Family History  Problem Relation Age of Onset  . Breast cancer Mother 84       recurrance at 65  . BRCA 1/2 Mother        BRCA2 +  . BRCA 1/2 Sister        BRCA2+  . Breast cancer Maternal Aunt   . Colon cancer Maternal Aunt   . Breast cancer Maternal Uncle   . Lung cancer Paternal Uncle   . Lung cancer Maternal Grandmother        smoker  . Lung cancer Maternal Grandfather        smoker  . Pancreatic cancer Maternal Uncle   . Stomach cancer Maternal Uncle     Social History Social History  Substance Use Topics  . Smoking status: Former Smoker    Packs/day: 0.25    Years: 12.00  Types: Cigarettes    Quit date: 11/05/2015  . Smokeless tobacco: Never Used  . Alcohol use No     Allergies   Percocet [oxycodone-acetaminophen]   Review of Systems Review of Systems  Constitutional: Negative for fever.  Musculoskeletal: Negative for arthralgias.  Skin: Positive for wound.  Neurological: Negative for weakness and numbness.     Physical Exam Updated Vital Signs BP 120/81 (BP Location: Left Arm)   Pulse (!) 57   Temp 98.6 F (37 C) (Oral)   Resp 18   Wt 64.9 kg (143 lb)   LMP 03/04/2017   SpO2 98%   BMI 21.12 kg/m   Physical Exam  Constitutional: She appears well-developed and well-nourished. No distress.  Nontoxic appearing.  HENT:  Head: Normocephalic and atraumatic.  Eyes: Right eye exhibits no discharge. Left eye exhibits no discharge.  Cardiovascular: Normal rate, regular rhythm and intact distal pulses.   Bilateral radial pulses are intact.    Pulmonary/Chest: Effort normal. No respiratory distress.  Neurological: She is alert. No sensory deficit. Coordination normal.  Skin: Skin is warm and dry. Capillary refill takes less than 2 seconds. No rash noted. She is not diaphoretic. No erythema. No pallor.  Patient has superficial avulsion of the finger pads of her left fourth and fifth fingertips. Slight oozing of blood noted with removal of bandage. No evidence of deep tissue injury or bone protrusion. The avulsions are very superficial. No involvement of the nail bed. Just involving the finger pads of the fourth and fifth fingertips. Good range of motion of her left fingers without difficulty or pain.  Psychiatric: She has a normal mood and affect. Her behavior is normal.  Nursing note and vitals reviewed.    ED Treatments / Results  Labs (all labs ordered are listed, but only abnormal results are displayed) Labs Reviewed - No data to display  EKG  EKG Interpretation None       Radiology No results found.  Procedures Wound repair Date/Time: 03/05/2017 11:43 PM Performed by: Waynetta Pean Authorized by: Waynetta Pean  Consent: Verbal consent obtained. Risks and benefits: risks, benefits and alternatives were discussed Consent given by: patient Patient understanding: patient states understanding of the procedure being performed Patient consent: the patient's understanding of the procedure matches consent given Site marked: the operative site was marked Required items: required blood products, implants, devices, and special equipment available Patient identity confirmed: verbally with patient Time out: Immediately prior to procedure a "time out" was called to verify the correct patient, procedure, equipment, support staff and site/side marked as required. Preparation: Patient was prepped and draped in the usual sterile fashion. Local anesthesia used: no  Anesthesia: Local anesthesia used: no  Sedation: Patient  sedated: no Patient tolerance: Patient tolerated the procedure well with no immediate complications Comments: Repair of avulsion injury to her left 4th and 5th finger pads.     (including critical care time)  Medications Ordered in ED Medications - No data to display   Initial Impression / Assessment and Plan / ED Course  I have reviewed the triage vital signs and the nursing notes.  Pertinent labs & imaging results that were available during my care of the patient were reviewed by me and considered in my medical decision making (see chart for details).    This is a 41 y.o. Female who presents to the emergency department complaining of a laceration to her left fourth and fifth fingertips sustained while working today. Patient reports she works at [employer] when  she accidentally cut the tips of her fingers on the meat slicer. Fingertips are wrapped prior to arrival. No pain medication prior to arrival. She denies other injury. Last tetanus shot was in 2015. On exam patient is afebrile and nontoxic appearing. She has superficial avulsion injuries of the finger pads of her left fourth and fifth finger pads. These are superficial in nature. No evidence of deep tissue injury. Good range of motion of her fingers without difficulty. No evidence of fracture or bone injury or foreign bodies.  The wound was repaired and I covered the wound with Xeroform dressing and covered in gauze. No further bleeding. Wound care instructions provided to the patient. Also provided the patient with supplies to address her wounds tomorrow. Bacitracin. Educated on return precautions. I advised the patient to follow-up with their primary care provider this week. I advised the patient to return to the emergency department with new or worsening symptoms or new concerns. The patient verbalized understanding and agreement with plan.      Final Clinical Impressions(s) / ED Diagnoses   Final diagnoses:  Avulsion of fingertip,  initial encounter    New Prescriptions New Prescriptions   BACITRACIN OINTMENT    Apply 1 application topically 2 (two) times daily.     Waynetta Pean, PA-C 03/05/17 0006    Ward, Delice Bison, DO 03/05/17 [phone]

## 2017-03-10 ENCOUNTER — Emergency Department (HOSPITAL_COMMUNITY)
Admission: EM | Admit: 2017-03-10 | Discharge: 2017-03-11 | Disposition: A | Discharge status: Home / Self Care | Payer: BLUE CROSS/BLUE SHIELD | Attending: Emergency Medicine | Admitting: Emergency Medicine

## 2017-03-10 ENCOUNTER — Emergency Department (HOSPITAL_COMMUNITY): Payer: BLUE CROSS/BLUE SHIELD

## 2017-03-10 ENCOUNTER — Encounter (HOSPITAL_COMMUNITY): Payer: Self-pay

## 2017-03-10 DIAGNOSIS — Z79899 Other long term (current) drug therapy: Secondary | ICD-10-CM | POA: Insufficient documentation

## 2017-03-10 DIAGNOSIS — F17210 Nicotine dependence, cigarettes, uncomplicated: Secondary | ICD-10-CM | POA: Diagnosis not present

## 2017-03-10 DIAGNOSIS — R0789 Other chest pain: Secondary | ICD-10-CM | POA: Diagnosis not present

## 2017-03-10 DIAGNOSIS — R079 Chest pain, unspecified: Secondary | ICD-10-CM | POA: Diagnosis not present

## 2017-03-10 LAB — BASIC METABOLIC PANEL
ANION GAP: 6 (ref 5–15)
BUN: 8 mg/dL (ref 6–20)
CHLORIDE: 108 mmol/L (ref 101–111)
CO2: 25 mmol/L (ref 22–32)
Calcium: 8.8 mg/dL — ABNORMAL LOW (ref 8.9–10.3)
Creatinine, Ser: 0.67 mg/dL (ref 0.44–1.00)
Glucose, Bld: 82 mg/dL (ref 65–99)
POTASSIUM: 3.4 mmol/L — AB (ref 3.5–5.1)
SODIUM: 139 mmol/L (ref 135–145)

## 2017-03-10 LAB — CBC
HEMATOCRIT: 38.1 % (ref 36.0–46.0)
HEMOGLOBIN: 12.5 g/dL (ref 12.0–15.0)
MCH: 30.6 pg (ref 26.0–34.0)
MCHC: 32.8 g/dL (ref 30.0–36.0)
MCV: 93.2 fL (ref 78.0–100.0)
Platelets: 206 10*3/uL (ref 150–400)
RBC: 4.09 MIL/uL (ref 3.87–5.11)
RDW: 11.6 % (ref 11.5–15.5)
WBC: 4.5 10*3/uL (ref 4.0–10.5)

## 2017-03-10 LAB — POCT I-STAT TROPONIN I: Troponin i, poc: 0.01 ng/mL (ref 0.00–0.08)

## 2017-03-10 MED ORDER — KETOROLAC TROMETHAMINE 30 MG/ML IJ SOLN
15.0000 mg | Freq: Once | INTRAMUSCULAR | Status: DC
  Filled 2017-03-10: qty 1

## 2017-03-10 MED ORDER — POTASSIUM CHLORIDE CRYS ER 20 MEQ PO TBCR
40.0000 meq | EXTENDED_RELEASE_TABLET | Freq: Once | ORAL | Status: AC
  Administered 2017-03-11: 40 meq via ORAL
  Filled 2017-03-10: qty 2

## 2017-03-10 NOTE — ED Triage Notes (Signed)
Pt c/o left sided chest and shoulder pain starting last night. She states that she thought it was gas last night but it persisted in to [place]. She denies any injury. Does not recall doing anything when the pain started. She states that the pain is worse with a deep breath. A&Ox4. Ambulatory.

## 2017-03-10 NOTE — ED Provider Notes (Signed)
[facility] DEPT Provider Note   CSN: [id number] Arrival date & time: 03/10/17  2135   By signing my name below, I, Whitney Jenkins, attest that this documentation has been prepared under the direction and in the presence of Sherwood Gambler, MD. Electronically signed, Whitney Jenkins, ED Scribe. 03/10/17. 11:36 PM.   History   Chief Complaint Chief Complaint  Patient presents with  . Chest Pain  . Shoulder Pain   The history is provided by the patient and medical records. No language interpreter was used.    Whitney Jenkins is a 41 y.o. female with h/o GERD presenting to the Emergency Department with chief complaint of L chest pain onset yesterday. 6-7/10, intermittent, sharp L chest pain radiating to the L shoulder blade that is worse with deep breathing described. Pt states she ate prior to the onset of her presenting pain. No other exacerbating factors noted. She states she took tums last night for similar symptoms with no note of acute relief. She adds she took tylenol for pain this evening without relief. H/o ulcerative colitis noted. No cough, SOB, leg swelling, long distance or travel h/o clots. No recent illnesses noted. No other complaints at this time.   Past Medical History:  Diagnosis Date  . Anemia    was on iron pills but states she has been off for a while  . Family history of breast cancer   . Family history of breast cancer in female   . Family history of pancreatic cancer   . FH: BRCA2 gene positive   . GERD (gastroesophageal reflux disease)    takes Omeprazole daily  . History of migraine    last one 2wks ago    Patient Active Problem List   Diagnosis Date Noted  . Genetic testing 02/22/2015  . BRCA2 positive 02/22/2015  . FH: BRCA2 gene positive   . Family history of breast cancer   . Family history of breast cancer in female   . Family history of pancreatic cancer   . Chronic cholecystitis 02/17/2014    Past Surgical History:  Procedure Laterality Date  .  CHOLECYSTECTOMY N/A 02/22/2014   Procedure: LAPAROSCOPIC CHOLECYSTECTOMY;  Surgeon: Harl Bowie, MD;  Location: [facility];  Service: General;  Laterality: N/A;  . KNEE ARTHROSCOPY Left     OB History    No data available       Home Medications    Prior to Admission medications   Medication Sig Start Date End Date Taking? Authorizing Provider  acetaminophen (TYLENOL) 500 MG tablet Take 1,000 mg by mouth every 6 (six) hours as needed for mild pain.   Yes [provider]  mesalamine (CANASA) 1000 MG suppository Place 1,000 mg rectally every other day.   Yes [provider]  mesalamine (LIALDA) 1.2 g EC tablet Take 2.4 g by mouth daily with breakfast.    Yes [provider]  omeprazole (PRILOSEC OTC) 20 MG tablet Take 1 tablet by mouth daily as needed (heart burn).    Yes [provider]  bacitracin ointment Apply 1 application topically 2 (two) times daily. Patient not taking: Reported on 03/10/2017 03/04/17   Waynetta Pean, PA-C  ibuprofen (ADVIL,MOTRIN) 800 MG tablet Take 1 tablet (800 mg total) by mouth 3 (three) times daily. Patient not taking: Reported on 03/10/2017 10/30/16   Orpah Greek, MD  traMADol (ULTRAM) 50 MG tablet Take 1 tablet (50 mg total) by mouth every 6 (six) hours as needed. Patient not taking: Reported on 03/10/2017  10/30/16   Orpah Greek, MD    Family History Family History  Problem Relation Age of Onset  . Breast cancer Mother 57       recurrance at 89  . BRCA 1/2 Mother        BRCA2 +  . BRCA 1/2 Sister        BRCA2+  . Breast cancer Maternal Aunt   . Colon cancer Maternal Aunt   . Breast cancer Maternal Uncle   . Lung cancer Paternal Uncle   . Lung cancer Maternal Grandmother        smoker  . Lung cancer Maternal Grandfather        smoker  . Pancreatic cancer Maternal Uncle   . Stomach cancer Maternal Uncle     Social History Social History  Substance Use Topics  . Smoking status: Former  Smoker    Packs/day: 0.25    Years: 12.00    Types: Cigarettes    Quit date: 11/05/2015  . Smokeless tobacco: Never Used  . Alcohol use No     Allergies   Percocet [oxycodone-acetaminophen]   Review of Systems Review of Systems  Constitutional: Negative for fever.  Respiratory: Negative for cough and shortness of breath.   Cardiovascular: Positive for chest pain. Negative for leg swelling.  Skin: Negative for wound.  All other systems reviewed and are negative.    Physical Exam Updated Vital Signs BP 102/75 (BP Location: Left Arm)   Pulse 61   Temp 97.9 F (36.6 C) (Oral)   Resp (!) 21   Ht 5' 9" (1.753 m)   Wt 140 lb (63.5 kg)   LMP 03/04/2017   SpO2 99%   BMI 20.67 kg/m   Physical Exam  Constitutional: She is oriented to person, place, and time. She appears well-developed and well-nourished.  HENT:  Head: Normocephalic and atraumatic.  Right Ear: External ear normal.  Left Ear: External ear normal.  Nose: Nose normal.  Eyes: Right eye exhibits no discharge. Left eye exhibits no discharge.  Cardiovascular: Normal rate, regular rhythm and normal heart sounds.   Pulses:      Radial pulses are 2+ on the right side, and 2+ on the left side.  Pulmonary/Chest: Effort normal and breath sounds normal. She exhibits tenderness.  Diffuse L anterior chest wall tenderness  Abdominal: Soft. There is no tenderness.  Musculoskeletal:  Diffuse L upper back tenderness near the scapula. Some worsening of pain with movement of L shoulder.  Neurological: She is alert and oriented to person, place, and time.  Skin: Skin is warm and dry.  Nursing note and vitals reviewed.    ED Treatments / Results  DIAGNOSTIC STUDIES: Oxygen Saturation is 99% on RA, NL by my interpretation.    COORDINATION OF CARE: 11:32 PM-Discussed next steps with pt. Pt verbalized understanding and is agreeable with the plan. Will order medications.   Labs (all labs ordered are listed, but only  abnormal results are displayed) Labs Reviewed  BASIC METABOLIC PANEL - Abnormal; Notable for the following:       Result Value   Potassium 3.4 (*)    Calcium 8.8 (*)    All other components within normal limits  CBC  D-DIMER, QUANTITATIVE (NOT AT [facility])  Randolm Idol, ED  POCT I-STAT TROPONIN I  Randolm Idol, ED  POCT I-STAT TROPONIN I    EKG  EKG Interpretation  Date/Time:  Tuesday March 10 2017 21:49:27 EDT Ventricular Rate:  62 PR Interval:  QRS Duration: 83 QT Interval:  408 QTC Calculation: 415 R Axis:   84 Text Interpretation:  Sinus rhythm ST elev, probable normal early repol pattern no significant change since Feb 2018 Confirmed by Sherwood Gambler [id number]) on 03/10/2017 11:02:33 PM       Radiology Dg Chest 2 View  Result Date: 03/10/2017 CLINICAL DATA:  Upper chest pain and back pain EXAM: CHEST  2 VIEW COMPARISON:  10/30/2016 FINDINGS: The heart size and mediastinal contours are within normal limits. Both lungs are clear. The visualized skeletal structures are unremarkable. IMPRESSION: No active cardiopulmonary disease. Electronically Signed   By: Donavan Foil M.D.   On: 03/10/2017 22:33    Procedures Procedures (including critical care time)  Medications Ordered in ED Medications  potassium chloride SA (K-DUR,KLOR-CON) 20 MEQ CR tablet (not administered)  potassium chloride SA (K-DUR,KLOR-CON) CR tablet 40 mEq (40 mEq Oral Given 03/11/17 0031)  ketorolac (TORADOL) 30 MG/ML injection 30 mg (30 mg Intramuscular Given 03/11/17 0027)     Initial Impression / Assessment and Plan / ED Course  I have reviewed the triage vital signs and the nursing notes.  Pertinent labs & imaging results that were available during my care of the patient were reviewed by me and considered in my medical decision making (see chart for details).     Patient is low risk for PE, given pleuritic pain, d-dimer checked and is negative. I think this effectively rules out PE. No  further workup indicated. I think ACS is much less likely. The pain is currently very reproducible and is likely chest wall inflammation. She is otherwise neurovascular intact. Discussed using NSAIDs and/or Tylenol, local heat, and follow up with PCP. Discussed return precautions. Highly doubt dissection.  Final Clinical Impressions(s) / ED Diagnoses   Final diagnoses:  Left-sided chest wall pain    New Prescriptions Discharge Medication List as of 03/11/2017  2:20 AM     I personally performed the services described in this documentation, which was scribed in my presence. The recorded information has been reviewed and is accurate.    Sherwood Gambler, MD 03/11/17 0430

## 2017-03-11 LAB — D-DIMER, QUANTITATIVE (NOT AT ARMC)

## 2017-03-11 LAB — POCT I-STAT TROPONIN I: TROPONIN I, POC: 0 ng/mL (ref 0.00–0.08)

## 2017-03-11 MED ORDER — POTASSIUM CHLORIDE CRYS ER 20 MEQ PO TBCR
EXTENDED_RELEASE_TABLET | ORAL | Status: AC
  Filled 2017-03-11: qty 2

## 2017-03-11 MED ORDER — KETOROLAC TROMETHAMINE 30 MG/ML IJ SOLN
30.0000 mg | Freq: Once | INTRAMUSCULAR | Status: AC
  Administered 2017-03-11: 30 mg via INTRAMUSCULAR

## 2017-03-12 ENCOUNTER — Ambulatory Visit
Admission: RE | Admit: 2017-03-12 | Discharge: 2017-03-12 | Disposition: A | Discharge status: Home / Self Care | Payer: BLUE CROSS/BLUE SHIELD | Source: Ambulatory Visit | Attending: Obstetrics & Gynecology | Admitting: Obstetrics & Gynecology

## 2017-03-12 DIAGNOSIS — Z803 Family history of malignant neoplasm of breast: Secondary | ICD-10-CM

## 2017-03-12 DIAGNOSIS — N6489 Other specified disorders of breast: Secondary | ICD-10-CM | POA: Diagnosis not present

## 2017-03-12 MED ORDER — GADOBENATE DIMEGLUMINE 529 MG/ML IV SOLN
13.0000 mL | Freq: Once | INTRAVENOUS | Status: AC | PRN
  Administered 2017-03-12: 13 mL via INTRAVENOUS

## 2017-03-13 ENCOUNTER — Encounter: Payer: Self-pay | Admitting: Family Medicine

## 2017-03-16 ENCOUNTER — Other Ambulatory Visit: Payer: Self-pay | Admitting: Obstetrics & Gynecology

## 2017-03-16 DIAGNOSIS — R928 Other abnormal and inconclusive findings on diagnostic imaging of breast: Secondary | ICD-10-CM

## 2017-03-27 ENCOUNTER — Ambulatory Visit
Admission: RE | Admit: 2017-03-27 | Discharge: 2017-03-27 | Disposition: A | Discharge status: Home / Self Care | Payer: BLUE CROSS/BLUE SHIELD | Source: Ambulatory Visit | Attending: Obstetrics & Gynecology | Admitting: Obstetrics & Gynecology

## 2017-03-27 DIAGNOSIS — R928 Other abnormal and inconclusive findings on diagnostic imaging of breast: Secondary | ICD-10-CM

## 2017-03-27 DIAGNOSIS — N641 Fat necrosis of breast: Secondary | ICD-10-CM | POA: Diagnosis not present

## 2017-03-27 DIAGNOSIS — N6489 Other specified disorders of breast: Secondary | ICD-10-CM | POA: Diagnosis not present

## 2017-03-27 MED ORDER — GADOBENATE DIMEGLUMINE 529 MG/ML IV SOLN
13.0000 mL | Freq: Once | INTRAVENOUS | Status: AC | PRN
  Administered 2017-03-27: 13 mL via INTRAVENOUS

## 2017-05-22 ENCOUNTER — Ambulatory Visit (INDEPENDENT_AMBULATORY_CARE_PROVIDER_SITE_OTHER): Payer: BLUE CROSS/BLUE SHIELD | Admitting: Family Medicine

## 2017-05-22 ENCOUNTER — Encounter: Payer: Self-pay | Admitting: Family Medicine

## 2017-05-22 VITALS — BP 120/80 | HR 60 | Temp 98.2°F | Resp 16 | Wt 144.8 lb

## 2017-05-22 DIAGNOSIS — F17200 Nicotine dependence, unspecified, uncomplicated: Secondary | ICD-10-CM

## 2017-05-22 DIAGNOSIS — G8929 Other chronic pain: Secondary | ICD-10-CM | POA: Diagnosis not present

## 2017-05-22 DIAGNOSIS — R0789 Other chest pain: Secondary | ICD-10-CM

## 2017-05-22 DIAGNOSIS — K219 Gastro-esophageal reflux disease without esophagitis: Secondary | ICD-10-CM | POA: Diagnosis not present

## 2017-05-22 NOTE — Patient Instructions (Signed)
Continue using Tylenol as needed and heat to the area.  Try to avoid strenuous activity for a short time and see if this calms down.  Start taking a multi-vitamin with vitamin D.  Stop smoking.  Return if your symptoms get worse or if they are not improving over the next few weeks.  If you develop any new symptoms such as fever or cough then let me know.    Costochondritis Costochondritis is swelling and irritation (inflammation) of the tissue (cartilage) that connects your ribs to your breastbone (sternum). This causes pain in the front of your chest. The pain usually starts gradually and involves more than one rib. What are the causes? The exact cause of this condition is not always known. It results from stress on the cartilage where your ribs attach to your sternum. The cause of this stress could be:  Chest injury (trauma).  Exercise or activity, such as lifting.  Severe coughing.  What increases the risk? You may be at higher risk for this condition if you:  Are female.  Are 49?40 years old.  Recently started a new exercise or work activity.  Have low levels of vitamin D.  Have a condition that makes you cough frequently.  What are the signs or symptoms? The main symptom of this condition is chest pain. The pain:  Usually starts gradually and can be sharp or dull.  Gets worse with deep breathing, coughing, or exercise.  Gets better with rest.  May be worse when you press on the sternum-rib connection (tenderness).  How is this diagnosed? This condition is diagnosed based on your symptoms, medical history, and a physical exam. Your health care provider will check for tenderness when pressing on your sternum. This is the most important finding. You may also have tests to rule out other causes of chest pain. These may include:  A chest X-ray to check for lung problems.  An electrocardiogram (ECG) to see if you have a heart problem that could be causing the  pain.  An imaging scan to rule out a chest or rib fracture.  How is this treated? This condition usually goes away on its own over time. Your health care provider may prescribe an NSAID to reduce pain and inflammation. Your health care provider may also suggest that you:  Rest and avoid activities that make pain worse.  Apply heat or cold to the area to reduce pain and inflammation.  Do exercises to stretch your chest muscles.  If these treatments do not help, your health care provider may inject a numbing medicine at the sternum-rib connection to help relieve the pain. Follow these instructions at home:  Avoid activities that make pain worse. This includes any activities that use chest, abdominal, and side muscles.  If directed, put ice on the painful area: ? Put ice in a plastic bag. ? Place a towel between your skin and the bag. ? Leave the ice on for 20 minutes, 2-3 times a day.  If directed, apply heat to the affected area as often as told by your health care provider. Use the heat source that your health care provider recommends, such as a moist heat pack or a heating pad. ? Place a towel between your skin and the heat source. ? Leave the heat on for 20-30 minutes. ? Remove the heat if your skin turns bright red. This is especially important if you are unable to feel pain, heat, or cold. You may have a greater risk of  getting burned.  Take over-the-counter and prescription medicines only as told by your health care provider.  Return to your normal activities as told by your health care provider. Ask your health care provider what activities are safe for you.  Keep all follow-up visits as told by your health care provider. This is important. Contact a health care provider if:  You have chills or a fever.  Your pain does not go away or it gets worse.  You have a cough that does not go away (is persistent). Get help right away if:  You have shortness of breath. This  information is not intended to replace advice given to you by your health care provider. Make sure you discuss any questions you have with your health care provider. Document Released: 06/25/2005 Document Revised: 04/04/2016 Document Reviewed: 01/09/2016 Elsevier Interactive Patient Education  [employer].

## 2017-05-22 NOTE — Progress Notes (Signed)
   Subjective:    Patient ID: Whitney Jenkins, female    DOB: 12/11/1975, 41 y.o.   MRN: [id number]  HPI Chief Complaint  Patient presents with  . follow-up    ER follow-up. was seen 2 months ago in ER for chest wall pain. still hasn't gone away and was told to follow-up   She is here with complaints of intermittent anterior chest pain since February 2018. She was seen in the ED and has a negative cardiac work up. No new symptoms since then.   States pain feels like a dull ache, non radiating and sometimes pressure. Pain lasts 10-15 minutes, she lays down and it goes away. States it is occurring mainly in the evening, once daily. Movement aggravates her chest pain. She reports soreness to her chest wall.  Leaning forward does not worsen or relieve her pain.   History of reflux and reports taking prilosec in the evening. This is unchanged.   She started smoking again and smokes 4-5 cigarettes per day. Denies alcohol or drug use.   No kids but she is raising her great niece who is 72 months old.   No leg pain. No history of DVT. No recent travel, surgery or immobilization.   Denies fever, chills, dizziness, chest pain, palpitations, shortness of breath, abdominal pain, N/V/D, urinary symptoms, LE edema.   Reviewed allergies, medications, past medical, surgical, family, and social history.    Review of Systems Pertinent positives and negatives in the history of present illness.     Objective:   Physical Exam BP 120/80   Pulse 60   Temp 98.2 F (36.8 C) (Oral)   Resp 16   Wt 144 lb 12.8 oz (65.7 kg)   SpO2 99%   BMI 21.38 kg/m   Alert and in no distress. Tympanic membranes and canals are normal. Pharyngeal area is normal. Neck is supple without adenopathy or thyromegaly. Cardiac exam shows a regular sinus rhythm without murmurs or gallops. Lungs are clear to auscultation. Extremities without edema, normal pulses, no calf swelling or tenderness.       Assessment & Plan:    Chest wall pain, chronic  Smoker  Gastroesophageal reflux disease, esophagitis presence not specified  Reviewed two ED visit notes, labs and XR results.  Reassured her that chest wall tenderness and pain with movement speaks to this being musculoskeletal. She has had a negative cardiac work up at the ED for the same symptoms and no new symptoms since then. No sign of any infectious process. I am not suspicious for a PE but this was considered.  Her pain does not appear to be related to reflux but recommend she continue with prilosec and avoid any food triggers.  Smoking cessation counseling done.  Recommend starting MVI with vitamin D. Avoid strenuous activity and take tylenol and use heat as needed. She will follow up if any new or worsening symptoms arise.

## 2017-07-16 DIAGNOSIS — K5900 Constipation, unspecified: Secondary | ICD-10-CM | POA: Diagnosis not present

## 2017-07-16 DIAGNOSIS — Z800 Family history of malignant neoplasm of digestive organs: Secondary | ICD-10-CM | POA: Diagnosis not present

## 2017-07-16 DIAGNOSIS — K5190 Ulcerative colitis, unspecified, without complications: Secondary | ICD-10-CM | POA: Diagnosis not present

## 2017-07-16 DIAGNOSIS — K219 Gastro-esophageal reflux disease without esophagitis: Secondary | ICD-10-CM | POA: Diagnosis not present

## 2017-11-26 DIAGNOSIS — K5190 Ulcerative colitis, unspecified, without complications: Secondary | ICD-10-CM | POA: Diagnosis not present

## 2017-11-26 DIAGNOSIS — R1032 Left lower quadrant pain: Secondary | ICD-10-CM | POA: Diagnosis not present

## 2017-11-26 DIAGNOSIS — K5904 Chronic idiopathic constipation: Secondary | ICD-10-CM | POA: Diagnosis not present

## 2017-11-26 DIAGNOSIS — R140 Abdominal distension (gaseous): Secondary | ICD-10-CM | POA: Diagnosis not present

## 2017-12-10 ENCOUNTER — Encounter: Payer: Self-pay | Admitting: Family Medicine

## 2017-12-10 ENCOUNTER — Ambulatory Visit (INDEPENDENT_AMBULATORY_CARE_PROVIDER_SITE_OTHER): Payer: BLUE CROSS/BLUE SHIELD | Admitting: Family Medicine

## 2017-12-10 ENCOUNTER — Institutional Professional Consult (permissible substitution): Payer: BLUE CROSS/BLUE SHIELD | Admitting: Family Medicine

## 2017-12-10 VITALS — BP 120/80 | HR 65 | Temp 98.7°F | Resp 16 | Wt 143.8 lb

## 2017-12-10 DIAGNOSIS — K5190 Ulcerative colitis, unspecified, without complications: Secondary | ICD-10-CM | POA: Diagnosis not present

## 2017-12-10 DIAGNOSIS — R5383 Other fatigue: Secondary | ICD-10-CM

## 2017-12-10 DIAGNOSIS — F17200 Nicotine dependence, unspecified, uncomplicated: Secondary | ICD-10-CM | POA: Diagnosis not present

## 2017-12-10 DIAGNOSIS — D72819 Decreased white blood cell count, unspecified: Secondary | ICD-10-CM | POA: Diagnosis not present

## 2017-12-10 NOTE — Progress Notes (Signed)
   Subjective:    Patient ID: Whitney Jenkins, female    DOB: June 25, 1976, 42 y.o.   MRN: [id number]  HPI Chief Complaint  Patient presents with  . discuss labs    discuss lab and possible hematologist   She is here to discuss low WBC count checked by her GI Dr. Collene Mares. She has a history of UC and states she had a flare up last month. Labs sent from Dr. Collene Mares show a WBC count of 3.3 on 07/16/2017 and then 2.5 on 11/26/2017. CBC w/diff was not done. Previously her WBC count was normal at 4.5 in 02/2017.   She was having issues with constipation and Dr. Collene Mares put her on Linzess recently and this is working well for her without causing diarrhea.   Denies fever, chills, night sweats, weight loss, dizziness, sore throat, chest pain, palpitations, shortness of breath, cough, wheezing, abdominal pain, N/V/D.  She does report increased fatigue but thinks this could be related to not sleeping well recently.   She is still smoking and does not want to stop.   Reviewed allergies, medications, past medical, surgical, family, and social history.    Review of Systems Pertinent positives and negatives in the history of present illness.     Objective:   Physical Exam BP 120/80   Pulse 65   Temp 98.7 F (37.1 C) (Oral)   Resp 16   Wt 143 lb 12.8 oz (65.2 kg)   LMP 11/17/2017   SpO2 98%   BMI 21.24 kg/m   Alert and oriented and in no acute distress. Pharyngeal area is normal. Neck is supple without adenopathy or thyromegaly. Cardiac exam shows a regular sinus rhythm without murmurs or gallops. Lungs are clear to auscultation. Skin is warm and dry, no pallor.       Assessment & Plan:  Leukopenia, unspecified type - Plan: CBC with Differential/Platelet, Comprehensive metabolic panel, TSH, Vitamin B12, Folate, HIV antibody  Fatigue, unspecified type - Plan: CBC with Differential/Platelet, Comprehensive metabolic panel, TSH, Vitamin B12, Folate, HIV antibody  Ulcerative colitis without  complications, unspecified location ([place]) - Plan: CBC with Differential/Platelet, Comprehensive metabolic panel, Vitamin D47, Folate  Smoker  Will check CBC with diff as well as other labs to look for underlying etiology for leukopenia and fatigue.  Smoking cessation done.  Dr. Collene Mares is treating her UC.  Follow up pending labs and will consider referral to hematology if WBC count is not improving.

## 2017-12-11 ENCOUNTER — Other Ambulatory Visit: Payer: Self-pay | Admitting: Family Medicine

## 2017-12-11 ENCOUNTER — Encounter: Payer: Self-pay | Admitting: Family Medicine

## 2017-12-11 DIAGNOSIS — D72819 Decreased white blood cell count, unspecified: Secondary | ICD-10-CM | POA: Insufficient documentation

## 2017-12-11 DIAGNOSIS — E538 Deficiency of other specified B group vitamins: Secondary | ICD-10-CM

## 2017-12-11 HISTORY — DX: Deficiency of other specified B group vitamins: E53.8

## 2017-12-11 HISTORY — DX: Decreased white blood cell count, unspecified: D72.819

## 2017-12-11 LAB — CBC WITH DIFFERENTIAL/PLATELET
BASOS: 1 %
Basophils Absolute: 0 10*3/uL (ref 0.0–0.2)
EOS (ABSOLUTE): 0.2 10*3/uL (ref 0.0–0.4)
EOS: 5 %
Hematocrit: 38.2 % (ref 34.0–46.6)
Hemoglobin: 12.3 g/dL (ref 11.1–15.9)
Immature Grans (Abs): 0 10*3/uL (ref 0.0–0.1)
Immature Granulocytes: 0 %
LYMPHS ABS: 1.5 10*3/uL (ref 0.7–3.1)
Lymphs: 46 %
MCH: 29.3 pg (ref 26.6–33.0)
MCHC: 32.2 g/dL (ref 31.5–35.7)
MCV: 91 fL (ref 79–97)
MONOS ABS: 0.3 10*3/uL (ref 0.1–0.9)
Monocytes: 9 %
NEUTROS ABS: 1.2 10*3/uL — AB (ref 1.4–7.0)
NEUTROS PCT: 39 %
Platelets: 199 10*3/uL (ref 150–379)
RBC: 4.2 x10E6/uL (ref 3.77–5.28)
RDW: 12.6 % (ref 12.3–15.4)
WBC: 3.2 10*3/uL — ABNORMAL LOW (ref 3.4–10.8)

## 2017-12-11 LAB — FOLATE: Folate: 8.1 ng/mL (ref >3.0)

## 2017-12-11 LAB — COMPREHENSIVE METABOLIC PANEL
A/G RATIO: 2 (ref 1.2–2.2)
ALK PHOS: 56 IU/L (ref 39–117)
ALT: 10 IU/L (ref 0–32)
AST: 16 IU/L (ref 0–40)
Albumin: 4.5 g/dL (ref 3.5–5.5)
BUN/Creatinine Ratio: 9 (ref 9–23)
BUN: 7 mg/dL (ref 6–24)
CALCIUM: 9 mg/dL (ref 8.7–10.2)
CHLORIDE: 108 mmol/L — AB (ref 96–106)
CO2: 23 mmol/L (ref 20–29)
Creatinine, Ser: 0.81 mg/dL (ref 0.57–1.00)
GFR calc Af Amer: 105 mL/min/{1.73_m2} (ref >59)
GFR, EST NON AFRICAN AMERICAN: 91 mL/min/{1.73_m2} (ref >59)
GLOBULIN, TOTAL: 2.3 g/dL (ref 1.5–4.5)
Glucose: 85 mg/dL (ref 65–99)
POTASSIUM: 4.6 mmol/L (ref 3.5–5.2)
SODIUM: 143 mmol/L (ref 134–144)
Total Protein: 6.8 g/dL (ref 6.0–8.5)

## 2017-12-11 LAB — HIV ANTIBODY (ROUTINE TESTING W REFLEX): HIV Screen 4th Generation wRfx: NONREACTIVE

## 2017-12-11 LAB — TSH: TSH: 0.841 u[IU]/mL (ref 0.450–4.500)

## 2017-12-11 LAB — VITAMIN B12: Vitamin B-12: 207 pg/mL — ABNORMAL LOW (ref 232–1245)

## 2017-12-11 MED ORDER — CYANOCOBALAMIN 2000 MCG PO TABS: 2000.0000 ug | ORAL_TABLET | Freq: Every day | ORAL | 1 refills | Status: DC

## 2017-12-15 ENCOUNTER — Encounter: Payer: Self-pay | Admitting: Family Medicine

## 2017-12-17 DIAGNOSIS — K5190 Ulcerative colitis, unspecified, without complications: Secondary | ICD-10-CM | POA: Diagnosis not present

## 2017-12-17 DIAGNOSIS — K5900 Constipation, unspecified: Secondary | ICD-10-CM | POA: Diagnosis not present

## 2018-01-01 DIAGNOSIS — N939 Abnormal uterine and vaginal bleeding, unspecified: Secondary | ICD-10-CM | POA: Diagnosis not present

## 2018-01-01 DIAGNOSIS — D259 Leiomyoma of uterus, unspecified: Secondary | ICD-10-CM | POA: Diagnosis not present

## 2018-01-01 DIAGNOSIS — N921 Excessive and frequent menstruation with irregular cycle: Secondary | ICD-10-CM | POA: Diagnosis not present

## 2018-01-18 ENCOUNTER — Encounter: Payer: Self-pay | Admitting: Family Medicine

## 2018-01-18 ENCOUNTER — Ambulatory Visit (INDEPENDENT_AMBULATORY_CARE_PROVIDER_SITE_OTHER): Payer: BLUE CROSS/BLUE SHIELD | Admitting: Family Medicine

## 2018-01-18 VITALS — BP 120/62 | HR 67 | Ht 70.5 in | Wt 142.8 lb

## 2018-01-18 DIAGNOSIS — E538 Deficiency of other specified B group vitamins: Secondary | ICD-10-CM | POA: Diagnosis not present

## 2018-01-18 DIAGNOSIS — K5190 Ulcerative colitis, unspecified, without complications: Secondary | ICD-10-CM

## 2018-01-18 DIAGNOSIS — Z862 Personal history of diseases of the blood and blood-forming organs and certain disorders involving the immune mechanism: Secondary | ICD-10-CM

## 2018-01-18 DIAGNOSIS — N920 Excessive and frequent menstruation with regular cycle: Secondary | ICD-10-CM

## 2018-01-18 DIAGNOSIS — D709 Neutropenia, unspecified: Secondary | ICD-10-CM | POA: Diagnosis not present

## 2018-01-18 NOTE — Progress Notes (Signed)
   Subjective:    Patient ID: Whitney Jenkins, female    DOB: 27-May-1976, 42 y.o.   MRN: [id number]  HPI Chief Complaint  Patient presents with  . follow-up    follow-up on b12 med. pt has ran out of med last week   She is here to follow up on abnormal labs. She had a low vitamin B 12 with a low normal hemoglobin. States she took the supplement that I prescribed for 30 days and ran out last week. No side effects from the supplement. Has not been taking a multivitamin.  She has UC and sees Dr. Collene Mares for this. Last flare up approximately one month ago per patient.   States her period came on at her usual time but lasted approximately 3 weeks. This ended one week ago.  She is seeing her OB/GYN for this and for a polyp in her uterus.   Denies any increased fatigue. No fever, chills, dizziness, chest pain, shortness of breath, abdominal pain, N/V/D.   Past Medical History:  Diagnosis Date  . Anemia    was on iron pills but states she has been off for a while  . Chronic chest wall pain   . Family history of breast cancer   . Family history of breast cancer in female   . Family history of pancreatic cancer   . FH: BRCA2 gene positive   . GERD (gastroesophageal reflux disease)    takes Omeprazole daily  . History of migraine    last one 2wks ago  . Leukopenia 12/11/2017  . Vitamin B12 deficiency 12/11/2017   Past Surgical History:  Procedure Laterality Date  . CHOLECYSTECTOMY N/A 02/22/2014   Procedure: LAPAROSCOPIC CHOLECYSTECTOMY;  Surgeon: Harl Bowie, MD;  Location: [facility];  Service: General;  Laterality: N/A;  . KNEE ARTHROSCOPY Left      Reviewed allergies, medications, past medical, surgical, family, and social history.   Review of Systems Pertinent positives and negatives in the history of present illness.     Objective:   Physical Exam BP 120/62   Pulse 67   Ht 5' 10.5" (1.791 m)   Wt 142 lb 12.8 oz (64.8 kg)   BMI 20.20 kg/m   Alert and oriented and in no acute  distress. Not otherwise examined.       Assessment & Plan:  Vitamin B12 deficiency - Plan: Vitamin B12  Ulcerative colitis without complications, unspecified location ([facility])  Neutropenia, unspecified type ([place]) - Plan: CBC with Differential/Platelet, Iron, TIBC and Ferritin Panel  History of anemia - Plan: Iron, TIBC and Ferritin Panel  Menorrhagia with regular cycle - Plan: Iron, TIBC and Ferritin Panel  She appears to be doing fine and asymptomatic even with recent heavy and long menstrual cycle. She is following up with Dr. Lynnette Caffey at [facility] for this issue.  She is scheduled with Dr. Collene Mares for management of UC, no flare up in the past few weeks per patient.  Will check labs and see if her B 12 level improved with recent month of oral supplementation.  Follow up pending labs.

## 2018-01-19 LAB — CBC WITH DIFFERENTIAL/PLATELET
BASOS: 0 %
Basophils Absolute: 0 10*3/uL (ref 0.0–0.2)
EOS (ABSOLUTE): 0.2 10*3/uL (ref 0.0–0.4)
EOS: 5 %
HEMATOCRIT: 37.8 % (ref 34.0–46.6)
Hemoglobin: 12.2 g/dL (ref 11.1–15.9)
Immature Grans (Abs): 0 10*3/uL (ref 0.0–0.1)
Immature Granulocytes: 0 %
LYMPHS ABS: 1.6 10*3/uL (ref 0.7–3.1)
Lymphs: 50 %
MCH: 29.3 pg (ref 26.6–33.0)
MCHC: 32.3 g/dL (ref 31.5–35.7)
MCV: 91 fL (ref 79–97)
MONOCYTES: 9 %
Monocytes Absolute: 0.3 10*3/uL (ref 0.1–0.9)
NEUTROS ABS: 1.1 10*3/uL — AB (ref 1.4–7.0)
Neutrophils: 36 %
Platelets: 172 10*3/uL (ref 150–379)
RBC: 4.16 x10E6/uL (ref 3.77–5.28)
RDW: 12.5 % (ref 12.3–15.4)
WBC: 3.2 10*3/uL — ABNORMAL LOW (ref 3.4–10.8)

## 2018-01-19 LAB — IRON,TIBC AND FERRITIN PANEL
FERRITIN: 8 ng/mL — AB (ref 15–150)
Iron Saturation: 7 % — CL (ref 15–55)
Iron: 25 ug/dL — ABNORMAL LOW (ref 27–159)
Total Iron Binding Capacity: 374 ug/dL (ref 250–450)
UIBC: 349 ug/dL (ref 131–425)

## 2018-01-19 LAB — VITAMIN B12: Vitamin B-12: 795 pg/mL (ref 232–1245)

## 2018-01-25 DIAGNOSIS — N924 Excessive bleeding in the premenopausal period: Secondary | ICD-10-CM | POA: Diagnosis not present

## 2018-01-25 DIAGNOSIS — N921 Excessive and frequent menstruation with irregular cycle: Secondary | ICD-10-CM | POA: Diagnosis not present

## 2018-02-15 ENCOUNTER — Ambulatory Visit (INDEPENDENT_AMBULATORY_CARE_PROVIDER_SITE_OTHER): Payer: BLUE CROSS/BLUE SHIELD

## 2018-02-15 ENCOUNTER — Ambulatory Visit (HOSPITAL_COMMUNITY)
Admission: EM | Admit: 2018-02-15 | Discharge: 2018-02-15 | Disposition: A | Discharge status: Home / Self Care | Payer: BLUE CROSS/BLUE SHIELD | Attending: Internal Medicine | Admitting: Internal Medicine

## 2018-02-15 ENCOUNTER — Encounter (HOSPITAL_COMMUNITY): Payer: Self-pay | Admitting: Emergency Medicine

## 2018-02-15 DIAGNOSIS — R0789 Other chest pain: Secondary | ICD-10-CM

## 2018-02-15 DIAGNOSIS — R079 Chest pain, unspecified: Secondary | ICD-10-CM | POA: Diagnosis not present

## 2018-02-15 MED ORDER — NAPROXEN 375 MG PO TABS: 375.0000 mg | ORAL_TABLET | Freq: Two times a day (BID) | ORAL | 0 refills | Status: DC

## 2018-02-15 NOTE — Discharge Instructions (Addendum)
Continue conservative management of rest, ice, heat, and gentle stretches Take naproxen as needed for pain relief (may cause abdominal discomfort, ulcers, and GI bleeds avoid taking with other NSAIDs) Avoid overhead activities Follow up with PCP if symptoms persist Return or go to the ER if you have any new or worsening symptoms (fever, chills, chest pain, abdominal pain, changes in bowel or bladder habits, pain radiating into lower legs, etc...)

## 2018-02-15 NOTE — ED Provider Notes (Signed)
[facility]   [id number] 02/15/18 Arrival Time: 1922   SUBJECTIVE:  Whitney Jenkins is a 42 y.o. female who presents with complaint of:  Chest Discomfort: Onset gradual, 7 days ago, with worsening course since that time.  Denies a precipitating event, but admits to frequent overhead activities at work.  Describes discomfort as constant and pressure like in nature. Discomfort does not radiate. Rates discomfort as a 9/10 in intensity. Associated symptoms: chest pressure/discomfort. Aggravating factors: rest. Alleviating factors: heat. Reports similar symptoms in the past and was diagnosed with bruised chest wall.  Modified activities and reports improvement in symptoms. Previous cardiac testing: EKG and CXR WNL.  Cardiac risk factors: smoking/ tobacco exposure. <1ppd for 6 years Patient's risk factors for DVT/PE: long duration of automobile or plane travel. Traveled to [place] twice within the past two weeks  The ASCVD Risk score Mikey Bussing DC Brooke Bonito., et al., 2013) failed to calculate for the following reasons:   Cannot find a previous HDL lab   Cannot find a previous total cholesterol lab    ROS: As per HPI. Past Medical History:  Diagnosis Date  . Anemia    was on iron pills but states she has been off for a while  . Chronic chest wall pain   . Family history of breast cancer   . Family history of breast cancer in female   . Family history of pancreatic cancer   . FH: BRCA2 gene positive   . GERD (gastroesophageal reflux disease)    takes Omeprazole daily  . History of migraine    last one 2wks ago  . Leukopenia 12/11/2017  . Vitamin B12 deficiency 12/11/2017   Past Surgical History:  Procedure Laterality Date  . CHOLECYSTECTOMY N/A 02/22/2014   Procedure: LAPAROSCOPIC CHOLECYSTECTOMY;  Surgeon: Harl Bowie, MD;  Location: [facility];  Service: General;  Laterality: N/A;  . KNEE ARTHROSCOPY Left    Allergies  Allergen Reactions  . Percocet [Oxycodone-Acetaminophen]  Nausea And Vomiting   No current facility-administered medications on file prior to encounter.    Current Outpatient Medications on File Prior to Encounter  Medication Sig Dispense Refill  . cyanocobalamin 2000 MCG tablet Take 1 tablet (2,000 mcg total) by mouth daily. 30 tablet 1  . mesalamine (CANASA) 1000 MG suppository Place 1,000 mg rectally every other day.    . mesalamine (LIALDA) 1.2 g EC tablet Take 2.4 g by mouth daily with breakfast.     . omeprazole (PRILOSEC OTC) 20 MG tablet Take 1 tablet by mouth daily as needed (heart burn).     Marland Kitchen acetaminophen (TYLENOL) 500 MG tablet Take 1,000 mg by mouth every 6 (six) hours as needed for mild pain.     Social History   Socioeconomic History  . Marital status: Single    Spouse name: Not on file  . Number of children: Not on file  . Years of education: Not on file  . Highest education level: Not on file  Occupational History  . Not on file  Social Needs  . Financial resource strain: Not on file  . Food insecurity:    Worry: Not on file    Inability: Not on file  . Transportation needs:    Medical: Not on file    Non-medical: Not on file  Tobacco Use  . Smoking status: Current Every Day Smoker    Packs/day: 0.25    Years: 12.00    Pack years: 3.00    Types: Cigarettes  Last attempt to quit: 11/05/2015    Years since quitting: 2.2  . Smokeless tobacco: Never Used  Substance and Sexual Activity  . Alcohol use: No  . Drug use: No  . Sexual activity: Not Currently  Lifestyle  . Physical activity:    Days per week: Not on file    Minutes per session: Not on file  . Stress: Not on file  Relationships  . Social connections:    Talks on phone: Not on file    Gets together: Not on file    Attends religious service: Not on file    Active member of club or organization: Not on file    Attends meetings of clubs or organizations: Not on file    Relationship status: Not on file  . Intimate partner violence:    Fear of  current or ex partner: Not on file    Emotionally abused: Not on file    Physically abused: Not on file    Forced sexual activity: Not on file  Other Topics Concern  . Not on file  Social History Narrative  . Not on file   Family History  Problem Relation Age of Onset  . Breast cancer Mother 78       recurrance at 47  . BRCA 1/2 Mother        BRCA2 +  . BRCA 1/2 Sister        BRCA2+  . Breast cancer Maternal Aunt   . Colon cancer Maternal Aunt   . Breast cancer Maternal Uncle   . Lung cancer Paternal Uncle   . Lung cancer Maternal Grandmother        smoker  . Lung cancer Maternal Grandfather        smoker  . Pancreatic cancer Maternal Uncle   . Stomach cancer Maternal Uncle      OBJECTIVE:  Vitals:   02/15/18 2047 02/15/18 2048  BP: 114/69   Pulse: (!) 47   Resp: 16   Temp: 98.4 F (36.9 C)   TempSrc: Oral   SpO2: 100%   Weight:  145 lb (65.8 kg)    General appearance: alert; no distress Eyes: PERRLA; EOMI; conjunctiva normal HENT: normocephalic; atraumatic Neck: supple Lungs: clear to auscultation bilaterally without adventitious breath sounds Heart: regular rate and rhythm.  Clear S1 and S2 without rubs, gallops, or murmur. No carotid bruits.  Radial pulses 2+ bilaterally Chest Wall: Tenderness with palpation of anterior chest.  No heaves, lifts or thrills Abdomen: soft, non-tender; bowel sounds normal; no masses or organomegaly; no guarding or rebound tenderness Extremities: no cyanosis or edema; symmetrical with no gross deformities Skin: warm and dry Psychological: alert and cooperative; normal mood and affect  ECG: Orders placed or performed during the hospital encounter of 02/15/18  . ED EKG  . ED EKG  EKG reviewed: Showed bradycardia with regular rhythm.  No elongated PR interval or dropped QRS complexes.  No ST wave depressions or elevations.  No t-wave inversions. Comparable with last EKG from 03/11/17.  Imaging:  Dg Chest 2 View  Result Date:  02/15/2018 CLINICAL DATA:  Centered chest pain EXAM: CHEST - 2 VIEW COMPARISON:  03/10/2017 FINDINGS: The heart size and mediastinal contours are within normal limits. Both lungs are clear. The visualized skeletal structures are unremarkable. IMPRESSION: No active cardiopulmonary disease. Electronically Signed   By: Donavan Foil M.D.   On: 02/15/2018 21:29     ASSESSMENT & PLAN:  1. Chest wall pain  Meds ordered this encounter  Medications  . naproxen (NAPROSYN) 375 MG tablet    Sig: Take 1 tablet (375 mg total) by mouth 2 (two) times daily.    Dispense:  20 tablet    Refill:  0    Order Specific Question:   Supervising Provider    Answer:   Wynona Luna [[id number]]    Continue conservative management of rest, ice, heat, and gentle stretches Take naproxen as needed for pain relief (may cause abdominal discomfort, ulcers, and GI bleeds avoid taking with other NSAIDs) Avoid overhead activities Follow up with PCP if symptoms persist Return or go to the ER if you have any new or worsening symptoms (fever, chills, chest pain, abdominal pain, changes in bowel or bladder habits, pain radiating into lower legs, etc...)   Reviewed expectations re: course of current medical issues. Questions answered. Outlined signs and symptoms indicating need for more acute intervention. Patient verbalized understanding. After Visit Summary given.   Lestine Box, PA-C 02/15/18 2156

## 2018-03-02 DIAGNOSIS — Z01419 Encounter for gynecological examination (general) (routine) without abnormal findings: Secondary | ICD-10-CM | POA: Diagnosis not present

## 2018-03-02 DIAGNOSIS — Z803 Family history of malignant neoplasm of breast: Secondary | ICD-10-CM | POA: Diagnosis not present

## 2018-03-02 DIAGNOSIS — Z6820 Body mass index (BMI) 20.0-20.9, adult: Secondary | ICD-10-CM | POA: Diagnosis not present

## 2018-03-02 DIAGNOSIS — Z800 Family history of malignant neoplasm of digestive organs: Secondary | ICD-10-CM | POA: Diagnosis not present

## 2018-03-02 DIAGNOSIS — Z1231 Encounter for screening mammogram for malignant neoplasm of breast: Secondary | ICD-10-CM | POA: Diagnosis not present

## 2018-03-02 DIAGNOSIS — Z8042 Family history of malignant neoplasm of prostate: Secondary | ICD-10-CM | POA: Diagnosis not present

## 2018-03-02 DIAGNOSIS — Z808 Family history of malignant neoplasm of other organs or systems: Secondary | ICD-10-CM | POA: Diagnosis not present

## 2018-05-20 DIAGNOSIS — Z809 Family history of malignant neoplasm, unspecified: Secondary | ICD-10-CM | POA: Diagnosis not present

## 2018-05-25 ENCOUNTER — Other Ambulatory Visit: Payer: Self-pay | Admitting: Obstetrics & Gynecology

## 2018-05-26 ENCOUNTER — Other Ambulatory Visit: Payer: Self-pay | Admitting: Obstetrics & Gynecology

## 2018-05-26 DIAGNOSIS — Z1501 Genetic susceptibility to malignant neoplasm of breast: Secondary | ICD-10-CM

## 2018-05-26 DIAGNOSIS — Z1509 Genetic susceptibility to other malignant neoplasm: Principal | ICD-10-CM

## 2018-06-15 ENCOUNTER — Encounter: Payer: Self-pay | Admitting: Oncology

## 2018-06-28 ENCOUNTER — Other Ambulatory Visit: Payer: BLUE CROSS/BLUE SHIELD

## 2018-07-09 DIAGNOSIS — H40013 Open angle with borderline findings, low risk, bilateral: Secondary | ICD-10-CM | POA: Diagnosis not present

## 2018-07-09 DIAGNOSIS — Z1509 Genetic susceptibility to other malignant neoplasm: Secondary | ICD-10-CM | POA: Diagnosis not present

## 2018-07-09 DIAGNOSIS — Z808 Family history of malignant neoplasm of other organs or systems: Secondary | ICD-10-CM | POA: Diagnosis not present

## 2018-07-14 ENCOUNTER — Telehealth: Payer: Self-pay | Admitting: Oncology

## 2018-07-14 NOTE — Telephone Encounter (Signed)
Pt cld to reschedule appt with Dr. Jana Hakim to 10/31 at 430pm.

## 2018-07-15 ENCOUNTER — Inpatient Hospital Stay: Payer: BLUE CROSS/BLUE SHIELD | Admitting: Oncology

## 2018-07-25 ENCOUNTER — Ambulatory Visit
Admission: RE | Admit: 2018-07-25 | Discharge: 2018-07-25 | Disposition: A | Discharge status: Home / Self Care | Payer: BLUE CROSS/BLUE SHIELD | Source: Ambulatory Visit | Attending: Obstetrics & Gynecology | Admitting: Obstetrics & Gynecology

## 2018-07-25 DIAGNOSIS — Z1501 Genetic susceptibility to malignant neoplasm of breast: Secondary | ICD-10-CM

## 2018-07-25 DIAGNOSIS — C50919 Malignant neoplasm of unspecified site of unspecified female breast: Secondary | ICD-10-CM | POA: Diagnosis not present

## 2018-07-25 DIAGNOSIS — Z1509 Genetic susceptibility to other malignant neoplasm: Principal | ICD-10-CM

## 2018-07-25 MED ORDER — GADOBENATE DIMEGLUMINE 529 MG/ML IV SOLN
13.0000 mL | Freq: Once | INTRAVENOUS | Status: AC | PRN
  Administered 2018-07-25: 13 mL via INTRAVENOUS

## 2018-07-29 ENCOUNTER — Inpatient Hospital Stay: Payer: BLUE CROSS/BLUE SHIELD | Attending: Oncology | Admitting: Oncology

## 2018-08-02 ENCOUNTER — Encounter (HOSPITAL_BASED_OUTPATIENT_CLINIC_OR_DEPARTMENT_OTHER): Payer: Self-pay

## 2018-08-02 ENCOUNTER — Ambulatory Visit (HOSPITAL_BASED_OUTPATIENT_CLINIC_OR_DEPARTMENT_OTHER): Admit: 2018-08-02 | Payer: BLUE CROSS/BLUE SHIELD | Admitting: Obstetrics & Gynecology

## 2018-08-02 SURGERY — SALPINGO-OOPHORECTOMY, BILATERAL, LAPAROSCOPIC
Anesthesia: General | Laterality: Bilateral

## 2018-09-30 ENCOUNTER — Encounter (HOSPITAL_BASED_OUTPATIENT_CLINIC_OR_DEPARTMENT_OTHER): Payer: Self-pay | Admitting: *Deleted

## 2018-10-05 ENCOUNTER — Encounter (HOSPITAL_BASED_OUTPATIENT_CLINIC_OR_DEPARTMENT_OTHER): Payer: Self-pay | Admitting: *Deleted

## 2018-10-05 ENCOUNTER — Other Ambulatory Visit: Payer: Self-pay

## 2018-10-05 NOTE — Progress Notes (Signed)
Spoke with patient via telephone for pre op interview. NPO after MN. Patient to take mesalimine and zantac AM of surgery with a sip of water. Will need UPT. EKG on chart and in Epic. Arrival time 0530.

## 2018-10-14 DIAGNOSIS — Z1501 Genetic susceptibility to malignant neoplasm of breast: Secondary | ICD-10-CM | POA: Diagnosis not present

## 2018-10-14 DIAGNOSIS — Z1502 Genetic susceptibility to malignant neoplasm of ovary: Secondary | ICD-10-CM | POA: Diagnosis not present

## 2018-10-19 NOTE — H&P (Signed)
Whitney Jenkins is an 43 y.o. female with BRCA2 desiring risk reducing BSO.  The patient is nulliparous and has definitively decided she does not desire child-bearing.  Patient has h/o Ulcerative colitis and has had robotic myomectomy previously.    Pertinent Gynecological History: Menses: flow is moderate Bleeding: regular Contraception: same-sex relationship DES exposure: unknown Blood transfusions: none Sexually transmitted diseases: no past history Previous GYN Procedures: n/a  Last mammogram: normal Date: 2019 Last pap: normal Date: 2019 OB History: G0  Menstrual History: Menarche age: n/a Patient's last menstrual period was 09/26/2018 (approximate).    Past Medical History:  Diagnosis Date  . Anemia    was on iron pills but states she has been off for a while  . Chronic chest wall pain   . Family history of breast cancer   . Family history of breast cancer in female   . Family history of pancreatic cancer   . FH: BRCA2 gene positive   . GERD (gastroesophageal reflux disease)    takes Omeprazole daily  . History of migraine    last one 2wks ago  . Leukopenia 12/11/2017  . Ulcerative colitis ([place])    Followed by Dr. Collene Mares  . Vitamin B12 deficiency 12/11/2017    Past Surgical History:  Procedure Laterality Date  . CHOLECYSTECTOMY N/A 02/22/2014   Procedure: LAPAROSCOPIC CHOLECYSTECTOMY;  Surgeon: Harl Bowie, MD;  Location: [facility];  Service: General;  Laterality: N/A;  . KNEE ARTHROSCOPY Left     Family History  Problem Relation Age of Onset  . Breast cancer Mother 6       recurrance at 63  . BRCA 1/2 Mother        BRCA2 +  . BRCA 1/2 Sister        BRCA2+  . Breast cancer Maternal Aunt   . Colon cancer Maternal Aunt   . Breast cancer Maternal Uncle   . Lung cancer Paternal Uncle   . Lung cancer Maternal Grandmother        smoker  . Lung cancer Maternal Grandfather        smoker  . Pancreatic cancer Maternal Uncle   . Stomach cancer Maternal Uncle      Social History:  reports that she has been smoking cigarettes. She has a 3.00 pack-year smoking history. She has never used smokeless tobacco. She reports that she does not drink alcohol or use drugs.  Allergies:  Allergies  Allergen Reactions  . Percocet [Oxycodone-Acetaminophen] Nausea And Vomiting    No medications prior to admission.    ROS  Height 5' 9"  (1.753 m), weight 63.5 kg, last menstrual period 09/26/2018. Physical Exam  Constitutional: She is oriented to person, place, and time. She appears well-developed and well-nourished.  GI: Soft. There is no rebound and no guarding.  Neurological: She is alert and oriented to person, place, and time.  Skin: Skin is warm and dry.  Psychiatric: She has a normal mood and affect. Her behavior is normal.    No results found for this or any previous visit (from the past 24 hour(s)).  No results found.  Assessment/Plan: 43 yo G0 with BRCA2 -Laparoscopic risk-reducing BSO -Patient is counseled re: risk of bleeding, infection, scarring, and damage to surrounding structures.  Patient understands the implications of surgical menopause including VMS, bone mineral density loss, cardiovascular health decline.  All questions were answered and the patient wishes to proceed.    Linda Hedges 10/19/2018, 11:02 AM

## 2018-10-20 ENCOUNTER — Ambulatory Visit (HOSPITAL_BASED_OUTPATIENT_CLINIC_OR_DEPARTMENT_OTHER)
Admission: RE | Admit: 2018-10-20 | Discharge: 2018-10-20 | Disposition: A | Discharge status: Home / Self Care | Payer: BLUE CROSS/BLUE SHIELD | Attending: Obstetrics & Gynecology | Admitting: Obstetrics & Gynecology

## 2018-10-20 ENCOUNTER — Encounter (HOSPITAL_BASED_OUTPATIENT_CLINIC_OR_DEPARTMENT_OTHER)
Admission: RE | Disposition: A | Discharge status: Home / Self Care | Payer: Self-pay | Source: Home / Self Care | Attending: Obstetrics & Gynecology

## 2018-10-20 ENCOUNTER — Other Ambulatory Visit: Payer: Self-pay

## 2018-10-20 ENCOUNTER — Encounter (HOSPITAL_BASED_OUTPATIENT_CLINIC_OR_DEPARTMENT_OTHER): Payer: Self-pay | Admitting: *Deleted

## 2018-10-20 ENCOUNTER — Ambulatory Visit (HOSPITAL_BASED_OUTPATIENT_CLINIC_OR_DEPARTMENT_OTHER): Payer: BLUE CROSS/BLUE SHIELD | Admitting: Anesthesiology

## 2018-10-20 DIAGNOSIS — Z1501 Genetic susceptibility to malignant neoplasm of breast: Secondary | ICD-10-CM

## 2018-10-20 DIAGNOSIS — K219 Gastro-esophageal reflux disease without esophagitis: Secondary | ICD-10-CM | POA: Diagnosis not present

## 2018-10-20 DIAGNOSIS — N83201 Unspecified ovarian cyst, right side: Secondary | ICD-10-CM | POA: Diagnosis not present

## 2018-10-20 DIAGNOSIS — Z803 Family history of malignant neoplasm of breast: Secondary | ICD-10-CM | POA: Insufficient documentation

## 2018-10-20 DIAGNOSIS — D215 Benign neoplasm of connective and other soft tissue of pelvis: Secondary | ICD-10-CM | POA: Insufficient documentation

## 2018-10-20 DIAGNOSIS — F17210 Nicotine dependence, cigarettes, uncomplicated: Secondary | ICD-10-CM | POA: Insufficient documentation

## 2018-10-20 DIAGNOSIS — N83202 Unspecified ovarian cyst, left side: Secondary | ICD-10-CM | POA: Diagnosis not present

## 2018-10-20 DIAGNOSIS — Z4002 Encounter for prophylactic removal of ovary: Secondary | ICD-10-CM | POA: Diagnosis not present

## 2018-10-20 DIAGNOSIS — N838 Other noninflammatory disorders of ovary, fallopian tube and broad ligament: Secondary | ICD-10-CM | POA: Diagnosis not present

## 2018-10-20 DIAGNOSIS — I96 Gangrene, not elsewhere classified: Secondary | ICD-10-CM | POA: Diagnosis not present

## 2018-10-20 DIAGNOSIS — Z1509 Genetic susceptibility to other malignant neoplasm: Secondary | ICD-10-CM

## 2018-10-20 DIAGNOSIS — Z1502 Genetic susceptibility to malignant neoplasm of ovary: Secondary | ICD-10-CM | POA: Diagnosis not present

## 2018-10-20 HISTORY — PX: LAPAROSCOPIC BILATERAL SALPINGO OOPHERECTOMY: SHX5890

## 2018-10-20 HISTORY — DX: Ulcerative colitis, unspecified, without complications: K51.90

## 2018-10-20 LAB — COMPREHENSIVE METABOLIC PANEL
ALK PHOS: 47 U/L (ref 38–126)
ALT: 12 U/L (ref 0–44)
AST: 19 U/L (ref 15–41)
Albumin: 4.3 g/dL (ref 3.5–5.0)
Anion gap: 8 (ref 5–15)
BILIRUBIN TOTAL: 0.4 mg/dL (ref 0.3–1.2)
BUN: 9 mg/dL (ref 6–20)
CO2: 23 mmol/L (ref 22–32)
Calcium: 8.5 mg/dL — ABNORMAL LOW (ref 8.9–10.3)
Chloride: 106 mmol/L (ref 98–111)
Creatinine, Ser: 0.75 mg/dL (ref 0.44–1.00)
GFR calc Af Amer: >60 mL/min (ref >60)
GFR calc non Af Amer: >60 mL/min (ref >60)
Glucose, Bld: 98 mg/dL (ref 70–99)
Potassium: 3.3 mmol/L — ABNORMAL LOW (ref 3.5–5.1)
Sodium: 137 mmol/L (ref 135–145)
Total Protein: 7.4 g/dL (ref 6.5–8.1)

## 2018-10-20 LAB — CBC
HEMATOCRIT: 39.3 % (ref 36.0–46.0)
Hemoglobin: 12 g/dL (ref 12.0–15.0)
MCH: 29.1 pg (ref 26.0–34.0)
MCHC: 30.5 g/dL (ref 30.0–36.0)
MCV: 95.2 fL (ref 80.0–100.0)
Platelets: 200 10*3/uL (ref 150–400)
RBC: 4.13 MIL/uL (ref 3.87–5.11)
RDW: 12.3 % (ref 11.5–15.5)
WBC: 4.2 10*3/uL (ref 4.0–10.5)
nRBC: 0 % (ref 0.0–0.2)

## 2018-10-20 LAB — POCT PREGNANCY, URINE: Preg Test, Ur: NEGATIVE

## 2018-10-20 LAB — ABO/RH: ABO/RH(D): B NEG

## 2018-10-20 LAB — TYPE AND SCREEN
ABO/RH(D): B NEG
Antibody Screen: NEGATIVE

## 2018-10-20 SURGERY — SALPINGO-OOPHORECTOMY, BILATERAL, LAPAROSCOPIC
Anesthesia: General | Laterality: Bilateral

## 2018-10-20 MED ORDER — LACTATED RINGERS IV SOLN
INTRAVENOUS | Status: DC
  Administered 2018-10-20 (×2): via INTRAVENOUS
  Filled 2018-10-20: qty 1000

## 2018-10-20 MED ORDER — OXYCODONE HCL 5 MG/5ML PO SOLN
5.0000 mg | Freq: Once | ORAL | Status: DC | PRN
  Filled 2018-10-20: qty 5

## 2018-10-20 MED ORDER — SCOPOLAMINE 1 MG/3DAYS TD PT72
MEDICATED_PATCH | TRANSDERMAL | Status: DC | PRN
  Administered 2018-10-20: 1 via TRANSDERMAL

## 2018-10-20 MED ORDER — FENTANYL CITRATE (PF) 100 MCG/2ML IJ SOLN
INTRAMUSCULAR | Status: AC
  Filled 2018-10-20: qty 2

## 2018-10-20 MED ORDER — SCOPOLAMINE 1 MG/3DAYS TD PT72
MEDICATED_PATCH | TRANSDERMAL | Status: AC
  Filled 2018-10-20: qty 1

## 2018-10-20 MED ORDER — IBUPROFEN 800 MG PO TABS: 800.0000 mg | ORAL_TABLET | Freq: Four times a day (QID) | ORAL | 0 refills | Status: DC | PRN

## 2018-10-20 MED ORDER — LIDOCAINE 2% (20 MG/ML) 5 ML SYRINGE
INTRAMUSCULAR | Status: DC | PRN
  Administered 2018-10-20: 80 mg via INTRAVENOUS

## 2018-10-20 MED ORDER — BUPIVACAINE HCL (PF) 0.25 % IJ SOLN
INTRAMUSCULAR | Status: AC
  Filled 2018-10-20: qty 30

## 2018-10-20 MED ORDER — DEXAMETHASONE SODIUM PHOSPHATE 10 MG/ML IJ SOLN
INTRAMUSCULAR | Status: DC | PRN
  Administered 2018-10-20: 10 mg via INTRAVENOUS

## 2018-10-20 MED ORDER — SUGAMMADEX SODIUM 200 MG/2ML IV SOLN
INTRAVENOUS | Status: AC
  Filled 2018-10-20: qty 2

## 2018-10-20 MED ORDER — ONDANSETRON HCL 4 MG/2ML IJ SOLN
INTRAMUSCULAR | Status: AC
  Filled 2018-10-20: qty 4

## 2018-10-20 MED ORDER — KETOROLAC TROMETHAMINE 30 MG/ML IJ SOLN
INTRAMUSCULAR | Status: AC
  Filled 2018-10-20: qty 1

## 2018-10-20 MED ORDER — DEXAMETHASONE SODIUM PHOSPHATE 10 MG/ML IJ SOLN
INTRAMUSCULAR | Status: AC
  Filled 2018-10-20: qty 1

## 2018-10-20 MED ORDER — HYDROCODONE-ACETAMINOPHEN 5-325 MG PO TABS
ORAL_TABLET | ORAL | Status: AC
  Filled 2018-10-20: qty 1

## 2018-10-20 MED ORDER — ROCURONIUM BROMIDE 100 MG/10ML IV SOLN
INTRAVENOUS | Status: DC | PRN
  Administered 2018-10-20: 40 mg via INTRAVENOUS

## 2018-10-20 MED ORDER — MIDAZOLAM HCL 2 MG/2ML IJ SOLN
INTRAMUSCULAR | Status: AC
  Filled 2018-10-20: qty 2

## 2018-10-20 MED ORDER — KETOROLAC TROMETHAMINE 15 MG/ML IJ SOLN
INTRAMUSCULAR | Status: DC | PRN
  Administered 2018-10-20: 30 mg via INTRAVENOUS

## 2018-10-20 MED ORDER — LIDOCAINE 2% (20 MG/ML) 5 ML SYRINGE
INTRAMUSCULAR | Status: AC
  Filled 2018-10-20: qty 5

## 2018-10-20 MED ORDER — FENTANYL CITRATE (PF) 100 MCG/2ML IJ SOLN
INTRAMUSCULAR | Status: DC | PRN
  Administered 2018-10-20: 25 ug via INTRAVENOUS
  Administered 2018-10-20 (×2): 50 ug via INTRAVENOUS

## 2018-10-20 MED ORDER — OXYCODONE HCL 5 MG PO TABS
5.0000 mg | ORAL_TABLET | Freq: Once | ORAL | Status: DC | PRN
  Filled 2018-10-20: qty 1

## 2018-10-20 MED ORDER — HYDROCODONE-ACETAMINOPHEN 5-325 MG PO TABS: 1.0000 | ORAL_TABLET | Freq: Four times a day (QID) | ORAL | 0 refills | Status: DC | PRN

## 2018-10-20 MED ORDER — HYDROCODONE-ACETAMINOPHEN 5-325 MG PO TABS
1.0000 | ORAL_TABLET | Freq: Once | ORAL | Status: AC
  Administered 2018-10-20: 1 via ORAL
  Filled 2018-10-20: qty 1

## 2018-10-20 MED ORDER — PROPOFOL 10 MG/ML IV BOLUS
INTRAVENOUS | Status: DC | PRN
  Administered 2018-10-20: 150 mg via INTRAVENOUS

## 2018-10-20 MED ORDER — METHYLENE BLUE 0.5 % INJ SOLN
INTRAVENOUS | Status: AC
  Filled 2018-10-20: qty 10

## 2018-10-20 MED ORDER — PROMETHAZINE HCL 25 MG/ML IJ SOLN
6.2500 mg | INTRAMUSCULAR | Status: DC | PRN
  Filled 2018-10-20: qty 1

## 2018-10-20 MED ORDER — KETOROLAC TROMETHAMINE 30 MG/ML IJ SOLN
30.0000 mg | Freq: Once | INTRAMUSCULAR | Status: DC | PRN
  Filled 2018-10-20: qty 1

## 2018-10-20 MED ORDER — MIDAZOLAM HCL 5 MG/5ML IJ SOLN
INTRAMUSCULAR | Status: DC | PRN
  Administered 2018-10-20: 2 mg via INTRAVENOUS

## 2018-10-20 MED ORDER — BUPIVACAINE HCL (PF) 0.25 % IJ SOLN
INTRAMUSCULAR | Status: DC | PRN
  Administered 2018-10-20: 6 mL

## 2018-10-20 MED ORDER — SUGAMMADEX SODIUM 200 MG/2ML IV SOLN
INTRAVENOUS | Status: DC | PRN
  Administered 2018-10-20: 200 mg via INTRAVENOUS

## 2018-10-20 MED ORDER — ONDANSETRON HCL 4 MG/2ML IJ SOLN
INTRAMUSCULAR | Status: DC | PRN
  Administered 2018-10-20 (×2): 4 mg via INTRAVENOUS

## 2018-10-20 MED ORDER — ROCURONIUM BROMIDE 100 MG/10ML IV SOLN
INTRAVENOUS | Status: AC
  Filled 2018-10-20: qty 1

## 2018-10-20 MED ORDER — LACTATED RINGERS IV SOLN
INTRAVENOUS | Status: DC
  Filled 2018-10-20: qty 1000

## 2018-10-20 MED ORDER — PROPOFOL 10 MG/ML IV BOLUS
INTRAVENOUS | Status: AC
  Filled 2018-10-20: qty 40

## 2018-10-20 MED ORDER — FENTANYL CITRATE (PF) 100 MCG/2ML IJ SOLN
25.0000 ug | INTRAMUSCULAR | Status: DC | PRN
  Administered 2018-10-20 (×2): 25 ug via INTRAVENOUS
  Filled 2018-10-20: qty 1

## 2018-10-20 SURGICAL SUPPLY — 50 items
ADH SKN CLS APL DERMABOND .7 (GAUZE/BANDAGES/DRESSINGS) ×1
APL SKNCLS STERI-STRIP NONHPOA (GAUZE/BANDAGES/DRESSINGS)
APPLICATOR COTTON TIP 6IN STRL (MISCELLANEOUS) IMPLANT
BARRIER ADHS 3X4 INTERCEED (GAUZE/BANDAGES/DRESSINGS) IMPLANT
BENZOIN TINCTURE PRP APPL 2/3 (GAUZE/BANDAGES/DRESSINGS) IMPLANT
BRR ADH 4X3 ABS CNTRL BYND (GAUZE/BANDAGES/DRESSINGS)
CANISTER SUCT 3000ML PPV (MISCELLANEOUS) IMPLANT
CANISTER SUCTION 1200CC (MISCELLANEOUS) IMPLANT
CATH ROBINSON RED A/P 14FR (CATHETERS) ×2 IMPLANT
COVER MAYO STAND STRL (DRAPES) ×2 IMPLANT
COVER WAND RF STERILE (DRAPES) ×2 IMPLANT
DERMABOND ADVANCED (GAUZE/BANDAGES/DRESSINGS) ×1
DERMABOND ADVANCED .7 DNX12 (GAUZE/BANDAGES/DRESSINGS) IMPLANT
DURAPREP 26ML APPLICATOR (WOUND CARE) ×2 IMPLANT
ELECT REM PT RETURN 9FT ADLT (ELECTROSURGICAL) ×2
ELECTRODE REM PT RTRN 9FT ADLT (ELECTROSURGICAL) ×1 IMPLANT
GAUZE 4X4 16PLY RFD (DISPOSABLE) ×2 IMPLANT
GLOVE BIO SURGEON STRL SZ 6 (GLOVE) ×2 IMPLANT
GLOVE BIOGEL PI IND STRL 6 (GLOVE) ×2 IMPLANT
GLOVE BIOGEL PI INDICATOR 6 (GLOVE) ×2
GOWN STRL REUS W/ TWL LRG LVL3 (GOWN DISPOSABLE) ×3 IMPLANT
GOWN STRL REUS W/TWL LRG LVL3 (GOWN DISPOSABLE) ×6
KIT TURNOVER CYSTO (KITS) ×2 IMPLANT
LIGASURE LAP L-HOOKWIRE 5 44CM (INSTRUMENTS) ×1 IMPLANT
NEEDLE INSUFFLATION 120MM (ENDOMECHANICALS) ×2 IMPLANT
NS IRRIG 500ML POUR BTL (IV SOLUTION) ×2 IMPLANT
PACK LAPAROSCOPY BASIN (CUSTOM PROCEDURE TRAY) ×2 IMPLANT
PACK TRENDGUARD 450 HYBRID PRO (MISCELLANEOUS) ×1 IMPLANT
PAD OB MATERNITY 4.3X12.25 (PERSONAL CARE ITEMS) ×2 IMPLANT
PENCIL BUTTON HOLSTER BLD 10FT (ELECTRODE) IMPLANT
SCISSORS LAP 5X35 DISP (ENDOMECHANICALS) IMPLANT
SEALER TISSUE G2 CVD JAW 45CM (ENDOMECHANICALS) IMPLANT
SET IRRIG TUBING LAPAROSCOPIC (IRRIGATION / IRRIGATOR) ×1 IMPLANT
STRIP CLOSURE SKIN 1/2X4 (GAUZE/BANDAGES/DRESSINGS) IMPLANT
STRIP CLOSURE SKIN 1/4X4 (GAUZE/BANDAGES/DRESSINGS) IMPLANT
SUT MNCRL AB 3-0 PS2 18 (SUTURE) ×2 IMPLANT
SUT VICRYL 0 UR6 27IN ABS (SUTURE) ×2 IMPLANT
SYR 10ML LL (SYRINGE) ×2 IMPLANT
SYR 20CC LL (SYRINGE) IMPLANT
SYS BAG RETRIEVAL 10MM (BASKET) ×4
SYSTEM BAG RETRIEVAL 10MM (BASKET) IMPLANT
TOWEL OR 17X24 6PK STRL BLUE (TOWEL DISPOSABLE) ×4 IMPLANT
TRENDGUARD 450 HYBRID PRO PACK (MISCELLANEOUS) ×2
TROCAR XCEL NON-BLD 11X100MML (ENDOMECHANICALS) ×2 IMPLANT
TROCAR XCEL NON-BLD 5MMX100MML (ENDOMECHANICALS) ×2 IMPLANT
TUBE CONNECTING 12X1/4 (SUCTIONS) IMPLANT
TUBING EXTENTION W/L.L. (IV SETS) IMPLANT
TUBING INSUF HEATED (TUBING) ×2 IMPLANT
WARMER LAPAROSCOPE (MISCELLANEOUS) ×2 IMPLANT
WATER STERILE IRR 500ML POUR (IV SOLUTION) ×2 IMPLANT

## 2018-10-20 NOTE — Discharge Instructions (Signed)
Call MD for T>100.4, severe abdominal pain, intractable nausea and/or vomiting, or respiratory distress.  Call office to scheduled postop incision check in 2 weeks.  No driving while taking narcotics.  Pelvic rest x 4 weeks.     Post Anesthesia Home Care Instructions  Activity: Get plenty of rest for the remainder of the day. A responsible individual must stay with you for 24 hours following the procedure.  For the next 24 hours, DO NOT: -Drive a car -Paediatric nurse -Drink alcoholic beverages -Take any medication unless instructed by your physician -Make any legal decisions or sign important papers.  Meals: Start with liquid foods such as gelatin or soup. Progress to regular foods as tolerated. Avoid greasy, spicy, heavy foods. If nausea and/or vomiting occur, drink only clear liquids until the nausea and/or vomiting subsides. Call your physician if vomiting continues.  Special Instructions/Symptoms: Your throat may feel dry or sore from the anesthesia or the breathing tube placed in your throat during surgery. If this causes discomfort, gargle with warm salt water. The discomfort should disappear within 24 hours.  If you had a scopolamine patch placed behind your ear for the management of post- operative nausea and/or vomiting:  1. The medication in the patch is effective for 72 hours, after which it should be removed.  Wrap patch in a tissue and discard in the trash. Wash hands thoroughly with soap and water. 2. You may remove the patch earlier than 72 hours if you experience unpleasant side effects which may include dry mouth, dizziness or visual disturbances. 3. Avoid touching the patch. Wash your hands with soap and water after contact with the patch.

## 2018-10-20 NOTE — Anesthesia Procedure Notes (Signed)
Procedure Name: Intubation Date/Time: 10/20/2018 7:21 AM Performed by: Gwyndolyn Saxon, CRNA Pre-anesthesia Checklist: Patient identified, Emergency Drugs available, Suction available and Patient being monitored Patient Re-evaluated:Patient Re-evaluated prior to induction Oxygen Delivery Method: Circle system utilized Preoxygenation: Pre-oxygenation with 100% oxygen Induction Type: IV induction Ventilation: Mask ventilation without difficulty Laryngoscope Size: Miller and 2 Grade View: Grade I Tube type: Oral Tube size: 7.0 mm Number of attempts: 1 Airway Equipment and Method: Stylet Placement Confirmation: ETT inserted through vocal cords under direct vision,  positive ETCO2 and breath sounds checked- equal and bilateral Secured at: 21 cm Tube secured with: Tape Dental Injury: Teeth and Oropharynx as per pre-operative assessment

## 2018-10-20 NOTE — Progress Notes (Signed)
No change to H&P.  Whitney Muldrow, DO 

## 2018-10-20 NOTE — Anesthesia Preprocedure Evaluation (Signed)
Anesthesia Evaluation  Patient identified by MRN, date of birth, ID band Patient awake    Reviewed: Allergy & Precautions, NPO status , Patient's Chart, lab work & pertinent test results  Airway Mallampati: II  TM Distance: >3 FB Neck ROM: Full    Dental no notable dental hx.    Pulmonary neg pulmonary ROS, Current Smoker,    Pulmonary exam normal breath sounds clear to auscultation       Cardiovascular negative cardio ROS Normal cardiovascular exam Rhythm:Regular Rate:Normal     Neuro/Psych negative neurological ROS  negative psych ROS   GI/Hepatic Neg liver ROS, GERD  Medicated,  Endo/Other  negative endocrine ROS  Renal/GU negative Renal ROS  negative genitourinary   Musculoskeletal negative musculoskeletal ROS (+)   Abdominal   Peds negative pediatric ROS (+)  Hematology negative hematology ROS (+)   Anesthesia Other Findings   Reproductive/Obstetrics negative OB ROS                             Anesthesia Physical Anesthesia Plan  ASA: II  Anesthesia Plan: General   Post-op Pain Management:    Induction: Intravenous  PONV Risk Score and Plan: 2 and Ondansetron, Dexamethasone and Treatment may vary due to age or medical condition  Airway Management Planned: Oral ETT  Additional Equipment:   Intra-op Plan:   Post-operative Plan: Extubation in OR  Informed Consent: I have reviewed the patients History and Physical, chart, labs and discussed the procedure including the risks, benefits and alternatives for the proposed anesthesia with the patient or authorized representative who has indicated his/her understanding and acceptance.     Dental advisory given  Plan Discussed with: CRNA and Surgeon  Anesthesia Plan Comments:         Anesthesia Quick Evaluation

## 2018-10-20 NOTE — Transfer of Care (Signed)
Immediate Anesthesia Transfer of Care Note  Patient: Whitney Jenkins  Procedure(s) Performed: LAPAROSCOPIC BILATERAL SALPINGO OOPHORECTOMY (Bilateral )  Patient Location: PACU  Anesthesia Type:General  Level of Consciousness: drowsy and patient cooperative  Airway & Oxygen Therapy: Patient Spontanous Breathing and Patient connected to nasal cannula oxygen  Post-op Assessment: Report given to RN and Post -op Vital signs reviewed and stable  Post vital signs: Reviewed and stable  Last Vitals:  Vitals Value Taken Time  BP 117/46 10/20/2018  8:52 AM  Temp    Pulse 74 10/20/2018  8:55 AM  Resp 18 10/20/2018  8:55 AM  SpO2 100 % 10/20/2018  8:55 AM  Vitals shown include unvalidated device data.  Last Pain:  Vitals:   10/20/18 0623  TempSrc:   PainSc: 0-No pain      Patients Stated Pain Goal: 10 (29/04/75 3391)  Complications: No apparent anesthesia complications

## 2018-10-20 NOTE — Anesthesia Postprocedure Evaluation (Signed)
Anesthesia Post Note  Patient: Adaleigh Warf  Procedure(s) Performed: LAPAROSCOPIC BILATERAL SALPINGO OOPHORECTOMY (Bilateral )     Patient location during evaluation: PACU Anesthesia Type: General Level of consciousness: awake and alert Pain management: pain level controlled Vital Signs Assessment: post-procedure vital signs reviewed and stable Respiratory status: spontaneous breathing, nonlabored ventilation, respiratory function stable and patient connected to nasal cannula oxygen Cardiovascular status: blood pressure returned to baseline and stable Postop Assessment: no apparent nausea or vomiting Anesthetic complications: no    Last Vitals:  Vitals:   10/20/18 0930 10/20/18 0945  BP: (!) 108/55 100/62  Pulse: 60 (!) 54  Resp: 18 15  Temp:    SpO2: 97% 96%    Last Pain:  Vitals:   10/20/18 0945  TempSrc:   PainSc: 5                  Eilan Mcinerny S

## 2018-10-20 NOTE — Op Note (Signed)
PROCEDURE DATE: 10/20/2018 PREOPERATIVE DIAGNOSIS: BRCA2 POSTOPERATIVE DIAGNOSIS: The Same PROCEDURE: Risk reducing Laparoscopic Bilateral salpingo-oophorectomy SURGEON: Dr. Linda Hedges  INDICATIONS: 43 y.o. BRCA2 mutation. Risks of surgery were discussed with the patient including but not limited to: bleeding which may require transfusion or reoperation; infection which may require antibiotics; injury to bowel, bladder, ureters or other surrounding organs; need for additional procedures including laparotomy; thromboembolic phenomenon, incisional problems and other postoperative/anesthesia complications. Written informed consent was obtained.  FINDINGS: Small uterus, normal adnexa bilaterally. Normal upper abdomen.  Normal appearing liver edge and appendix.  Small, white, unattached density in posterior cul-de-sac   ANESTHESIA: General  ESTIMATED BLOOD LOSS: 5 ml  SPECIMENS: 1. Pelvic washings  2.  Unattached pelvic mass  3.  Bilateral fallopian tubes and ovaries COMPLICATIONS: None immediate  PROCEDURE IN DETAIL: The patient had sequential compression devices applied to her lower extremities while in the preoperative area. She was then taken to the operating room where general anesthesia was administered and was found to be adequate. She was placed in the dorsal lithotomy position, and was prepped and draped in a sterile manner. An in and out catheterization was performed. A uterine manipulator was then advanced into the uterus . After an adequate timeout was performed, attention was then turned to the patient's abdomen where a 10-mm skin incision was made in the umbilical fold. The Veress needle was carefully introduced into the peritoneal cavity through the abdominal wall. Intraperitoneal placement was confirmed by drop in intraabdominal pressure with insufflation of carbon dioxide gas. Adequate pneumoperitoneum was obtained, and the 11 mm trocar and sleeve were then advanced without difficulty into  the abdomen where intraabdominal placement was confirmed by the laparoscope. A survey of the patient's pelvis and abdomen revealed the above dictated findings. Suprapubic 5 mm port was then placed under direct visualization. The pelvis was then carefully examined and pelvic washings were collected and sent to pathology. The unattached mass in the cul-de-sac was then removed using an Endocatch bag and also sent to pathology separately.  I suspect fibroid based on appearance and texture. On the right side, the fallopian tube was elevated and using Ligasure, the IP was clamped, cauterized and cut at the pelvic brim. Using serial clamp, coagulate, cut bites, the right fallopian tube and ovary were removed making sure the tubal segment removed abutted the cornu. The same procedure was performed on the contralateral side.  Both specimens were removed together using an Endocatch bag and sent to pathology. The ureters were noted to be safely away from the area of dissection.   All pedicles were hemostatic. Insufflation was removed after all instruments were removed.  Infraumbilical fascia was closed using a running 0 vicryl stitch.  All skin incisions were closed with 4-0 monocryl subcuticular stitches and Dermabond. The patient tolerated the procedures well. All instruments, needles, and sponge counts were correct x 2. The patient was taken to the recovery room awake, extubated and in stable condition.

## 2018-10-21 ENCOUNTER — Encounter (HOSPITAL_BASED_OUTPATIENT_CLINIC_OR_DEPARTMENT_OTHER): Payer: Self-pay | Admitting: Obstetrics & Gynecology

## 2018-12-12 DIAGNOSIS — H6981 Other specified disorders of Eustachian tube, right ear: Secondary | ICD-10-CM | POA: Diagnosis not present

## 2019-01-06 DIAGNOSIS — K5904 Chronic idiopathic constipation: Secondary | ICD-10-CM | POA: Diagnosis not present

## 2019-01-06 DIAGNOSIS — R1013 Epigastric pain: Secondary | ICD-10-CM | POA: Diagnosis not present

## 2019-01-06 DIAGNOSIS — K5190 Ulcerative colitis, unspecified, without complications: Secondary | ICD-10-CM | POA: Diagnosis not present

## 2019-01-06 DIAGNOSIS — K219 Gastro-esophageal reflux disease without esophagitis: Secondary | ICD-10-CM | POA: Diagnosis not present

## 2019-01-19 DIAGNOSIS — R6881 Early satiety: Secondary | ICD-10-CM | POA: Diagnosis not present

## 2019-01-19 DIAGNOSIS — K219 Gastro-esophageal reflux disease without esophagitis: Secondary | ICD-10-CM | POA: Diagnosis not present

## 2019-01-19 DIAGNOSIS — R1013 Epigastric pain: Secondary | ICD-10-CM | POA: Diagnosis not present

## 2019-01-27 ENCOUNTER — Encounter: Payer: Self-pay | Admitting: Family Medicine

## 2019-02-02 DIAGNOSIS — Z79890 Hormone replacement therapy (postmenopausal): Secondary | ICD-10-CM | POA: Diagnosis not present

## 2019-02-02 DIAGNOSIS — N951 Menopausal and female climacteric states: Secondary | ICD-10-CM | POA: Diagnosis not present

## 2019-02-02 DIAGNOSIS — Z1501 Genetic susceptibility to malignant neoplasm of breast: Secondary | ICD-10-CM | POA: Diagnosis not present

## 2019-02-15 DIAGNOSIS — K5904 Chronic idiopathic constipation: Secondary | ICD-10-CM | POA: Diagnosis not present

## 2019-02-15 DIAGNOSIS — K219 Gastro-esophageal reflux disease without esophagitis: Secondary | ICD-10-CM | POA: Diagnosis not present

## 2019-02-15 DIAGNOSIS — K625 Hemorrhage of anus and rectum: Secondary | ICD-10-CM | POA: Diagnosis not present

## 2019-02-15 DIAGNOSIS — R1013 Epigastric pain: Secondary | ICD-10-CM | POA: Diagnosis not present

## 2019-04-19 IMAGING — MR MR BREAST BIOPSY
10 of 15 series · 33 of 48 positions shown · IV contrast (13ml Multihance)
Comparison: Previous exams.

ADDENDUM:
Pathology revealed pseudoangiomatous stromal hyperplasia and focal
fat necrosis in the LEFT breast. This was found to be concordant by
Dr. Engy Brundage. Pathology results were discussed with the patient
by telephone. The patient reported doing well after the biopsy. Post
biopsy instructions and care were reviewed and questions were
answered. The patient was encouraged to call The [REDACTED] of
instructed to return for annual screening mammography and bilateral
breast MRI since she is BRCA positive and informed that a reminder
letter would be sent regarding this appointment.

Pathology results reported by Darion Huncho RN, BSN on 03/30/2017.
CLINICAL DATA: Abnormal MRI enhancement left breast for biopsy
EXAM:
MRI GUIDED CORE NEEDLE BIOPSY OF THE LEFT BREAST
TECHNIQUE: Multiplanar, multisequence MR imaging of the left breast was
performed both before and after administration of intravenous
contrast.
CONTRAST:  13 mL MultiHance

[Series 3: axial pre-cm · axial · non-contrast · 1.3mm · 0.73mm/px · z∈[-77,+109]mm · 4 of 144 slices shown]
[im 1/144]
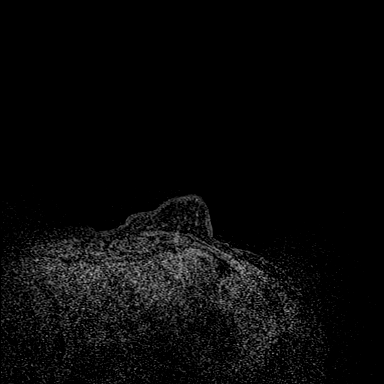
[im 48/144]
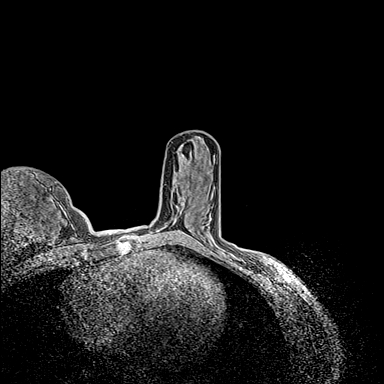
[im 96/144]
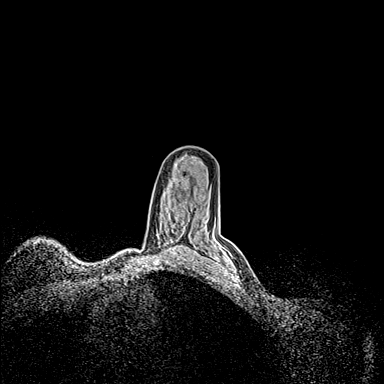
[im 144/144]
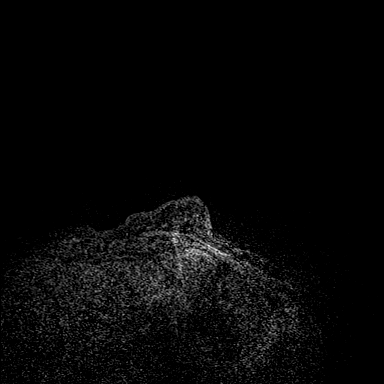

[Series 4: axial post 20 · axial · 1.3mm · 0.73mm/px · z∈[-77,+109]mm · 4 of 144 slices shown (1 of 3)]
[im 1/144]
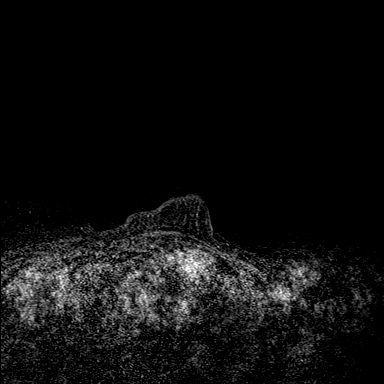
[im 48/144]
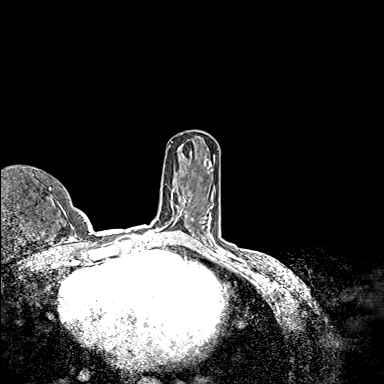
[im 96/144]
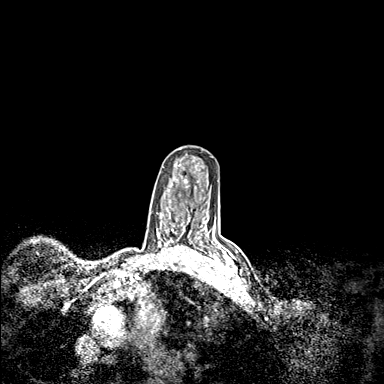
[im 144/144]
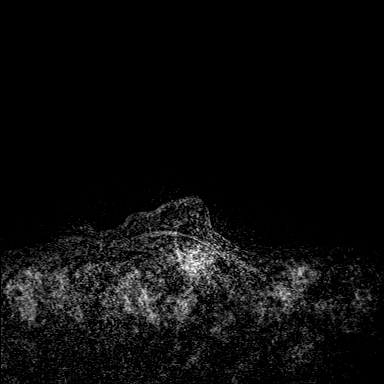

[Series 5: axial post 20 · axial · 1.3mm · 0.73mm/px · z∈[-77,+109]mm · 4 of 144 slices shown (2 of 3)]
[im 1/144]
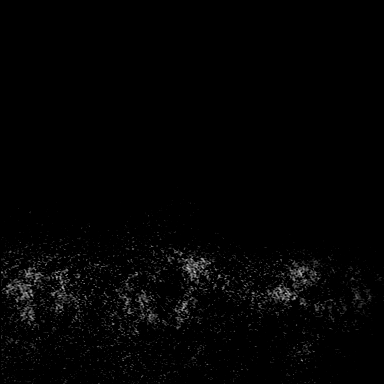
[im 48/144]
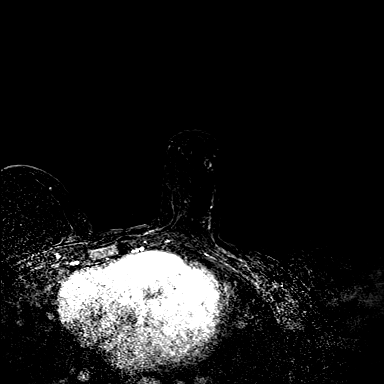
[im 96/144]
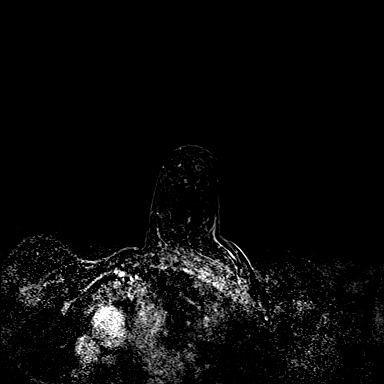
[im 144/144]
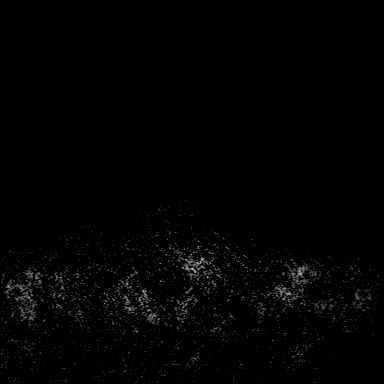

[Series 6: axial pre-cm_s3_(id) · axial · non-contrast · 1.3mm · 0.73mm/px · z∈[-77,+109]mm · 3 of 144 slices shown]
[im 1/144]
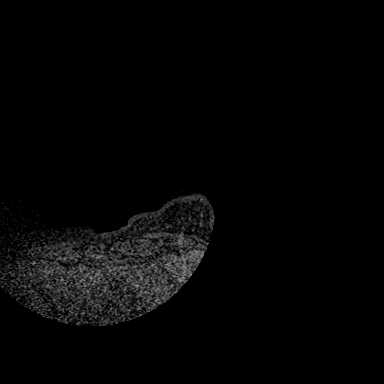
[im 72/144]
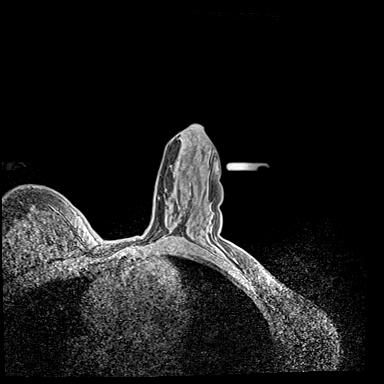
[im 144/144]
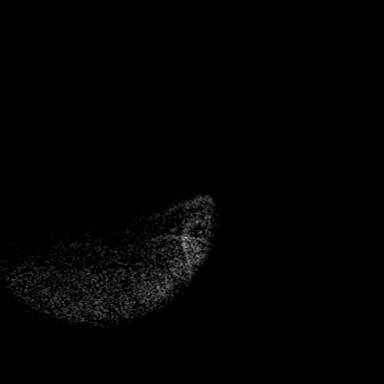

[Series 7: axial post 20 · axial · 1.3mm · 0.73mm/px · z∈[-77,+109]mm · 3 of 144 slices shown (3 of 3)]
[im 1/144]
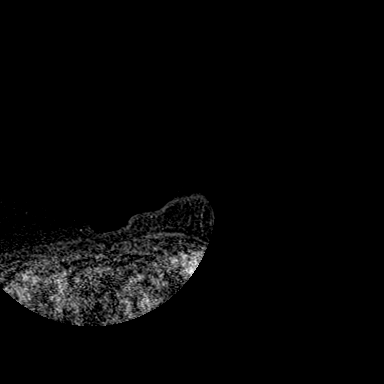
[im 72/144]
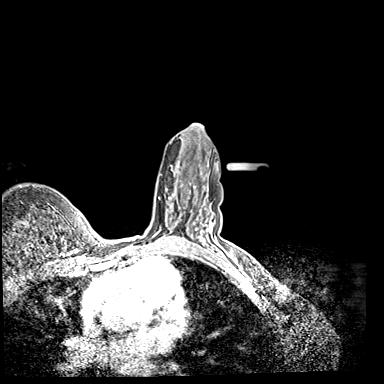
[im 144/144]
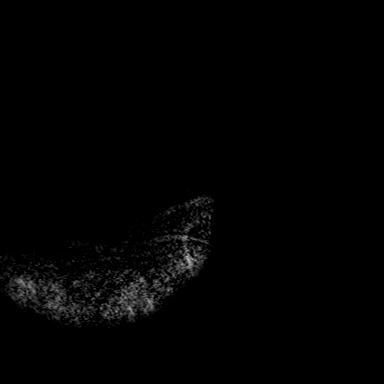

[Series 8: axial post 3 · axial · 1.3mm · 0.73mm/px · z∈[-77,+109]mm · 3 of 144 slices shown (1 of 3)]
[im 1/144]
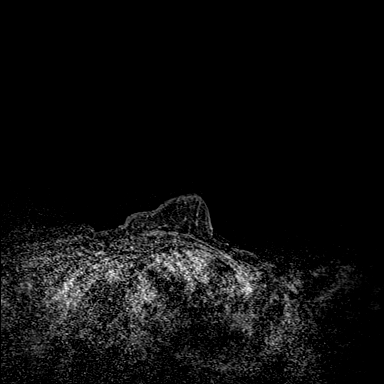
[im 72/144]
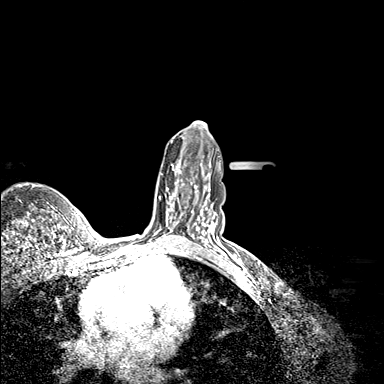
[im 144/144]
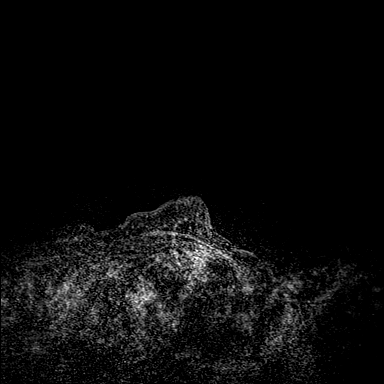

[Series 9: axial post 3 · axial · 1.3mm · 0.73mm/px · z∈[-77,+109]mm · 3 of 144 slices shown (2 of 3)]
[im 1/144]
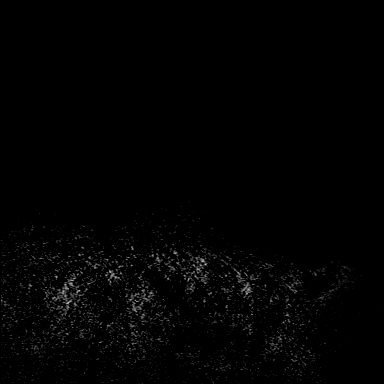
[im 72/144]
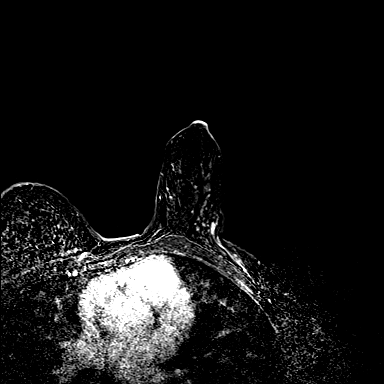
[im 144/144]
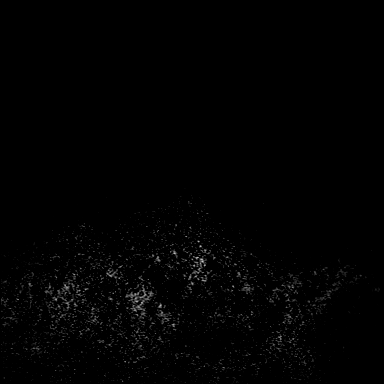

[Series 10: axial post 3 · axial · 1.3mm · 0.73mm/px · z∈[-77,+109]mm · 3 of 144 slices shown (3 of 3)]
[im 1/144]
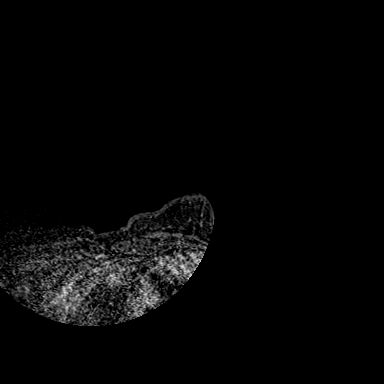
[im 72/144]
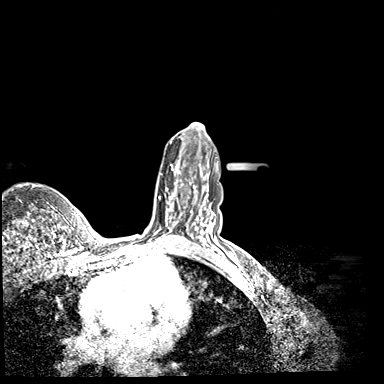
[im 144/144]
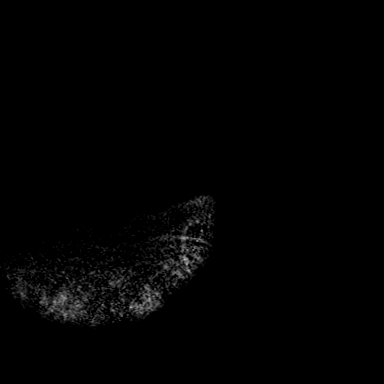

[Series 11: axial post 5 · axial · 1.3mm · 0.73mm/px · z∈[-77,+109]mm · 3 of 144 slices shown (1 of 2)]
[im 1/144]
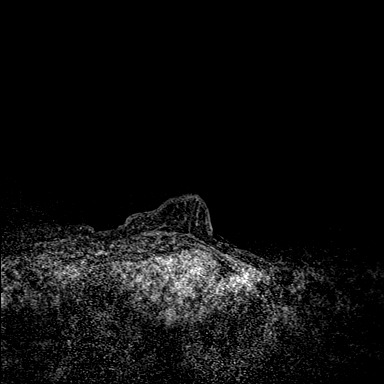
[im 72/144]
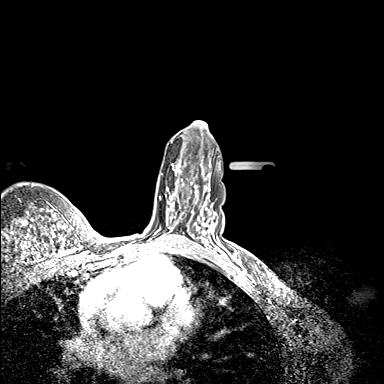
[im 144/144]
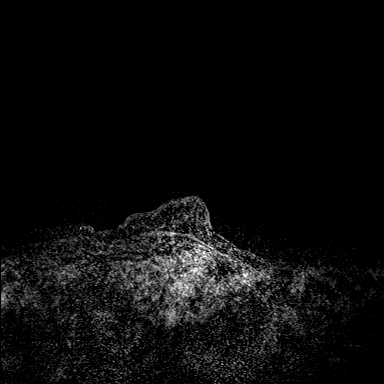

[Series 12: axial post 5 · axial · 1.3mm · 0.73mm/px · z∈[-77,+109]mm · 3 of 144 slices shown (2 of 2)]
[im 1/144]
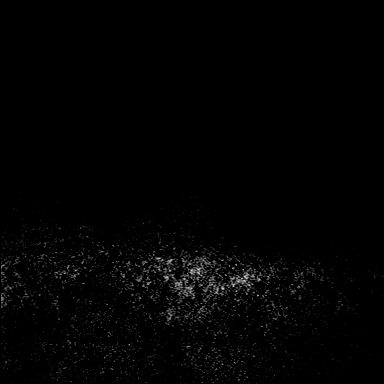
[im 72/144]
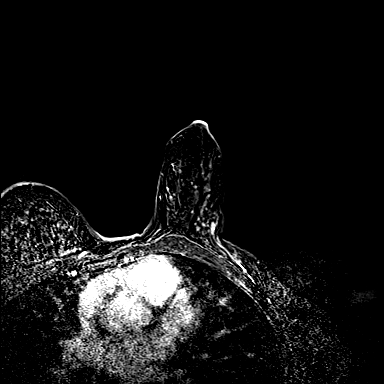
[im 144/144]
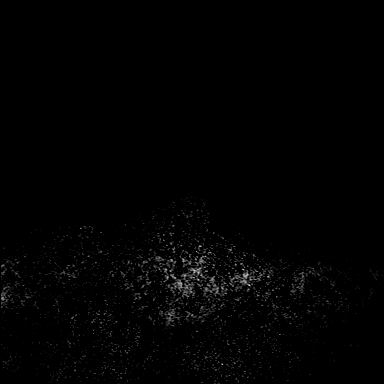

[33 of 48 positions shown; findings below may reference images not displayed]

FINDINGS: I met with the patient, and we discussed the procedure of MRI guided
biopsy, including risks, benefits, and alternatives. Specifically,
we discussed the risks of infection, bleeding, tissue injury, clip
migration, and inadequate sampling. Informed, written consent was
given. The usual time out protocol was performed immediately prior
to the procedure.

Using sterile technique, 1% Lidocaine, MRI guidance, and a 9 gauge
vacuum assisted device, biopsy was performed of enhancement in the
upper-outer quadrant middle depth left breast using a lateral
approach. At the conclusion of the procedure, a tissue marker clip
was deployed into the biopsy cavity. Follow-up 2-view mammogram was
performed and dictated separately.
IMPRESSION: MRI guided biopsy of left breast. No apparent complications.

## 2019-05-27 DIAGNOSIS — M722 Plantar fascial fibromatosis: Secondary | ICD-10-CM | POA: Diagnosis not present

## 2019-05-27 DIAGNOSIS — M71571 Other bursitis, not elsewhere classified, right ankle and foot: Secondary | ICD-10-CM | POA: Diagnosis not present

## 2019-05-27 DIAGNOSIS — M7671 Peroneal tendinitis, right leg: Secondary | ICD-10-CM | POA: Diagnosis not present

## 2019-05-27 DIAGNOSIS — M7732 Calcaneal spur, left foot: Secondary | ICD-10-CM | POA: Diagnosis not present

## 2019-05-27 DIAGNOSIS — M7731 Calcaneal spur, right foot: Secondary | ICD-10-CM | POA: Diagnosis not present

## 2019-05-27 DIAGNOSIS — M71572 Other bursitis, not elsewhere classified, left ankle and foot: Secondary | ICD-10-CM | POA: Diagnosis not present

## 2019-05-29 DIAGNOSIS — Z20828 Contact with and (suspected) exposure to other viral communicable diseases: Secondary | ICD-10-CM | POA: Diagnosis not present

## 2019-06-03 DIAGNOSIS — M71571 Other bursitis, not elsewhere classified, right ankle and foot: Secondary | ICD-10-CM | POA: Diagnosis not present

## 2019-06-03 DIAGNOSIS — M71572 Other bursitis, not elsewhere classified, left ankle and foot: Secondary | ICD-10-CM | POA: Diagnosis not present

## 2019-06-03 DIAGNOSIS — M7672 Peroneal tendinitis, left leg: Secondary | ICD-10-CM | POA: Diagnosis not present

## 2019-06-03 DIAGNOSIS — M7671 Peroneal tendinitis, right leg: Secondary | ICD-10-CM | POA: Diagnosis not present

## 2019-06-03 DIAGNOSIS — M722 Plantar fascial fibromatosis: Secondary | ICD-10-CM | POA: Diagnosis not present

## 2019-06-10 DIAGNOSIS — M7671 Peroneal tendinitis, right leg: Secondary | ICD-10-CM | POA: Diagnosis not present

## 2019-06-10 DIAGNOSIS — M71572 Other bursitis, not elsewhere classified, left ankle and foot: Secondary | ICD-10-CM | POA: Diagnosis not present

## 2019-06-10 DIAGNOSIS — M722 Plantar fascial fibromatosis: Secondary | ICD-10-CM | POA: Diagnosis not present

## 2019-06-10 DIAGNOSIS — M71571 Other bursitis, not elsewhere classified, right ankle and foot: Secondary | ICD-10-CM | POA: Diagnosis not present

## 2019-06-16 DIAGNOSIS — M71572 Other bursitis, not elsewhere classified, left ankle and foot: Secondary | ICD-10-CM | POA: Diagnosis not present

## 2019-06-16 DIAGNOSIS — M7671 Peroneal tendinitis, right leg: Secondary | ICD-10-CM | POA: Diagnosis not present

## 2019-06-16 DIAGNOSIS — M71571 Other bursitis, not elsewhere classified, right ankle and foot: Secondary | ICD-10-CM | POA: Diagnosis not present

## 2019-06-16 DIAGNOSIS — M722 Plantar fascial fibromatosis: Secondary | ICD-10-CM | POA: Diagnosis not present

## 2019-06-29 ENCOUNTER — Encounter (HOSPITAL_COMMUNITY): Payer: Self-pay | Admitting: Emergency Medicine

## 2019-06-29 ENCOUNTER — Emergency Department (HOSPITAL_COMMUNITY)
Admission: EM | Admit: 2019-06-29 | Discharge: 2019-06-29 | Discharge status: Against Medical Advice | Payer: BC Managed Care – PPO | Attending: Emergency Medicine | Admitting: Emergency Medicine

## 2019-06-29 ENCOUNTER — Other Ambulatory Visit: Payer: Self-pay

## 2019-06-29 ENCOUNTER — Encounter: Payer: Self-pay | Admitting: Family Medicine

## 2019-06-29 ENCOUNTER — Ambulatory Visit (INDEPENDENT_AMBULATORY_CARE_PROVIDER_SITE_OTHER): Payer: Self-pay | Admitting: Family Medicine

## 2019-06-29 VITALS — Temp 98.4°F | Wt 140.0 lb

## 2019-06-29 DIAGNOSIS — R112 Nausea with vomiting, unspecified: Secondary | ICD-10-CM | POA: Diagnosis not present

## 2019-06-29 DIAGNOSIS — Z5321 Procedure and treatment not carried out due to patient leaving prior to being seen by health care provider: Secondary | ICD-10-CM | POA: Insufficient documentation

## 2019-06-29 DIAGNOSIS — R197 Diarrhea, unspecified: Secondary | ICD-10-CM

## 2019-06-29 DIAGNOSIS — R252 Cramp and spasm: Secondary | ICD-10-CM

## 2019-06-29 LAB — COMPREHENSIVE METABOLIC PANEL
ALT: 23 U/L (ref 0–44)
AST: 27 U/L (ref 15–41)
Albumin: 4.3 g/dL (ref 3.5–5.0)
Alkaline Phosphatase: 58 U/L (ref 38–126)
Anion gap: 11 (ref 5–15)
BUN: 14 mg/dL (ref 6–20)
CO2: 27 mmol/L (ref 22–32)
Calcium: 9.3 mg/dL (ref 8.9–10.3)
Chloride: 102 mmol/L (ref 98–111)
Creatinine, Ser: 0.85 mg/dL (ref 0.44–1.00)
GFR calc Af Amer: >60 mL/min (ref >60)
GFR calc non Af Amer: >60 mL/min (ref >60)
Glucose, Bld: 109 mg/dL — ABNORMAL HIGH (ref 70–99)
Potassium: 3.8 mmol/L (ref 3.5–5.1)
Sodium: 140 mmol/L (ref 135–145)
Total Bilirubin: 0.7 mg/dL (ref 0.3–1.2)
Total Protein: 7.8 g/dL (ref 6.5–8.1)

## 2019-06-29 LAB — CBC
HCT: 43.3 % (ref 36.0–46.0)
Hemoglobin: 13.5 g/dL (ref 12.0–15.0)
MCH: 30.2 pg (ref 26.0–34.0)
MCHC: 31.2 g/dL (ref 30.0–36.0)
MCV: 96.9 fL (ref 80.0–100.0)
Platelets: 214 10*3/uL (ref 150–400)
RBC: 4.47 MIL/uL (ref 3.87–5.11)
RDW: 11.9 % (ref 11.5–15.5)
WBC: 6.2 10*3/uL (ref 4.0–10.5)
nRBC: 0 % (ref 0.0–0.2)

## 2019-06-29 LAB — URINALYSIS, ROUTINE W REFLEX MICROSCOPIC
Bacteria, UA: NONE SEEN
Bilirubin Urine: NEGATIVE
Glucose, UA: NEGATIVE mg/dL
Ketones, ur: NEGATIVE mg/dL
Nitrite: NEGATIVE
Protein, ur: NEGATIVE mg/dL
Specific Gravity, Urine: 1.012 (ref 1.005–1.030)
pH: 6 (ref 5.0–8.0)

## 2019-06-29 LAB — I-STAT BETA HCG BLOOD, ED (MC, WL, AP ONLY): I-stat hCG, quantitative: <5 m[IU]/mL (ref <5)

## 2019-06-29 LAB — LIPASE, BLOOD: Lipase: 38 U/L (ref 11–51)

## 2019-06-29 MED ORDER — SODIUM CHLORIDE 0.9% FLUSH: 3.0000 mL | Freq: Once | INTRAVENOUS | Status: DC

## 2019-06-29 NOTE — Progress Notes (Signed)
   Subjective:  Documentation for virtual audio and video telecommunications through [place] encounter:  The patient was located at work.  2 patient identifiers used.  The provider was located in the office. The patient did consent to this visit and is aware of possible charges through their insurance for this visit.  The other persons participating in this telemedicine service were none.    Patient ID: Whitney Jenkins, female    DOB: [birth year], 43 y.o.   MRN: [id number]  HPI Chief Complaint  Patient presents with  . muscle cramping    muscle cramping in sheen and thigh, nausated , vomitting and diarrhea last night and still having some nausea.    Complains of bilateral leg cramps that woke her up last night. She also reports nausea and vomiting. She has had several episodes of diarrhea.   She went to the ED and had vitals and labs but left without being seen.   Denies fever, chills, body aches, chest pain, palpitations, shortness of breath, cough, abdominal pain, urinary symptoms.    GI is Dr. Collene Mares. Had normal EGD earlier this year.  OB/GYN- Dr. Lynnette Caffey.    Review of Systems Pertinent positives and negatives in the history of present illness.     Objective:   Physical Exam Temp 98.4 F (36.9 C)   Wt 140 lb (63.5 kg)   BMI 20.67 kg/m  Alert and oriented and in no acute distress.  Respirations unlabored.  She is at work during the visit.      Assessment & Plan:  Leg cramps  Non-intractable vomiting with nausea, unspecified vomiting type  Diarrhea, unspecified type  Discussed staying well-hydrated and adding Gatorade or Powerade by diluting it with water.  Reviewed ED notes from last night and lab results.  Labs unremarkable.  Negative pregnancy test.  Discussed that she may be coming down with a GI viral illness and that we need to pay close attention to her symptoms over the next few days.  Advised her to avoid working if she is continuing to have vomiting or  diarrhea. Recommend that she take a multivitamin, take warm baths and stretch before bed.  Follow-up as needed  Time spent on call was 14 minutes and in review of previous records 1 minutes total.  This virtual service is not related to other E/M service within previous 7 days.

## 2019-06-29 NOTE — ED Notes (Signed)
Pt did not respond when called for vitals check

## 2019-06-29 NOTE — ED Notes (Signed)
Pt still did not respond when called for vitals check

## 2019-06-29 NOTE — ED Triage Notes (Signed)
Pt c/o bilateral leg pain that woke her from sleep tonight, along with nausea/vomiting/diarrhea. Denies fever/cough/chest pain/shortness of breath.

## 2019-06-30 ENCOUNTER — Ambulatory Visit: Payer: BLUE CROSS/BLUE SHIELD | Admitting: Family Medicine

## 2019-07-02 ENCOUNTER — Encounter (HOSPITAL_COMMUNITY): Payer: Self-pay | Admitting: Emergency Medicine

## 2019-07-02 ENCOUNTER — Other Ambulatory Visit: Payer: Self-pay

## 2019-07-02 DIAGNOSIS — F17210 Nicotine dependence, cigarettes, uncomplicated: Secondary | ICD-10-CM | POA: Insufficient documentation

## 2019-07-02 DIAGNOSIS — N3090 Cystitis, unspecified without hematuria: Secondary | ICD-10-CM | POA: Diagnosis not present

## 2019-07-02 DIAGNOSIS — M62830 Muscle spasm of back: Secondary | ICD-10-CM | POA: Insufficient documentation

## 2019-07-02 DIAGNOSIS — N3000 Acute cystitis without hematuria: Secondary | ICD-10-CM | POA: Diagnosis not present

## 2019-07-02 DIAGNOSIS — R110 Nausea: Secondary | ICD-10-CM | POA: Diagnosis not present

## 2019-07-02 LAB — CBC
HCT: 44.1 % (ref 36.0–46.0)
Hemoglobin: 13.8 g/dL (ref 12.0–15.0)
MCH: 29.8 pg (ref 26.0–34.0)
MCHC: 31.3 g/dL (ref 30.0–36.0)
MCV: 95.2 fL (ref 80.0–100.0)
Platelets: 233 10*3/uL (ref 150–400)
RBC: 4.63 MIL/uL (ref 3.87–5.11)
RDW: 11.8 % (ref 11.5–15.5)
WBC: 4.5 10*3/uL (ref 4.0–10.5)
nRBC: 0 % (ref 0.0–0.2)

## 2019-07-02 MED ORDER — ONDANSETRON 4 MG PO TBDP: 4.0000 mg | ORAL_TABLET | Freq: Once | ORAL | Status: DC | PRN

## 2019-07-02 MED ORDER — SODIUM CHLORIDE 0.9% FLUSH: 3.0000 mL | Freq: Once | INTRAVENOUS | Status: DC

## 2019-07-02 NOTE — ED Triage Notes (Signed)
Pt reports having nausea since Tuesday along with muscle cramps in legs.

## 2019-07-03 ENCOUNTER — Emergency Department (HOSPITAL_COMMUNITY)
Admission: EM | Admit: 2019-07-03 | Discharge: 2019-07-03 | Disposition: A | Discharge status: Home / Self Care | Payer: BC Managed Care – PPO | Attending: Emergency Medicine | Admitting: Emergency Medicine

## 2019-07-03 DIAGNOSIS — Z8639 Personal history of other endocrine, nutritional and metabolic disease: Secondary | ICD-10-CM

## 2019-07-03 DIAGNOSIS — N3000 Acute cystitis without hematuria: Secondary | ICD-10-CM

## 2019-07-03 DIAGNOSIS — M62830 Muscle spasm of back: Secondary | ICD-10-CM

## 2019-07-03 LAB — COMPREHENSIVE METABOLIC PANEL
ALT: 20 U/L (ref 0–44)
AST: 23 U/L (ref 15–41)
Albumin: 4.8 g/dL (ref 3.5–5.0)
Alkaline Phosphatase: 52 U/L (ref 38–126)
Anion gap: 8 (ref 5–15)
BUN: 8 mg/dL (ref 6–20)
CO2: 27 mmol/L (ref 22–32)
Calcium: 9.3 mg/dL (ref 8.9–10.3)
Chloride: 104 mmol/L (ref 98–111)
Creatinine, Ser: 0.79 mg/dL (ref 0.44–1.00)
GFR calc Af Amer: >60 mL/min (ref >60)
GFR calc non Af Amer: >60 mL/min (ref >60)
Glucose, Bld: 125 mg/dL — ABNORMAL HIGH (ref 70–99)
Potassium: 3.6 mmol/L (ref 3.5–5.1)
Sodium: 139 mmol/L (ref 135–145)
Total Bilirubin: 0.4 mg/dL (ref 0.3–1.2)
Total Protein: 8.2 g/dL — ABNORMAL HIGH (ref 6.5–8.1)

## 2019-07-03 LAB — URINALYSIS, MICROSCOPIC (REFLEX): RBC / HPF: NONE SEEN RBC/hpf (ref 0–5)

## 2019-07-03 LAB — LIPASE, BLOOD: Lipase: 32 U/L (ref 11–51)

## 2019-07-03 LAB — URINALYSIS, ROUTINE W REFLEX MICROSCOPIC
Bilirubin Urine: NEGATIVE
Glucose, UA: NEGATIVE mg/dL
Hgb urine dipstick: NEGATIVE
Ketones, ur: NEGATIVE mg/dL
Nitrite: NEGATIVE
Protein, ur: NEGATIVE mg/dL
Specific Gravity, Urine: 1.01 (ref 1.005–1.030)
pH: 6 (ref 5.0–8.0)

## 2019-07-03 LAB — MAGNESIUM: Magnesium: 1.9 mg/dL (ref 1.7–2.4)

## 2019-07-03 LAB — CK: Total CK: 209 U/L (ref 38–234)

## 2019-07-03 MED ORDER — METHOCARBAMOL 500 MG PO TABS
500.0000 mg | ORAL_TABLET | Freq: Once | ORAL | Status: AC
  Administered 2019-07-03: 500 mg via ORAL
  Filled 2019-07-03: qty 1

## 2019-07-03 MED ORDER — FAMOTIDINE 20 MG PO TABS
20.0000 mg | ORAL_TABLET | Freq: Once | ORAL | Status: AC
  Administered 2019-07-03: 20 mg via ORAL
  Filled 2019-07-03: qty 1

## 2019-07-03 MED ORDER — METHOCARBAMOL 500 MG PO TABS: 500.0000 mg | ORAL_TABLET | Freq: Two times a day (BID) | ORAL | 0 refills | Status: DC

## 2019-07-03 MED ORDER — ONDANSETRON 4 MG PO TBDP
4.0000 mg | ORAL_TABLET | Freq: Once | ORAL | Status: AC
  Administered 2019-07-03: 4 mg via ORAL
  Filled 2019-07-03: qty 1

## 2019-07-03 MED ORDER — ONDANSETRON 4 MG PO TBDP: 4.0000 mg | ORAL_TABLET | Freq: Three times a day (TID) | ORAL | 0 refills | Status: DC | PRN

## 2019-07-03 MED ORDER — CEPHALEXIN 500 MG PO CAPS: 500.0000 mg | ORAL_CAPSULE | Freq: Four times a day (QID) | ORAL | 0 refills | Status: AC

## 2019-07-03 NOTE — ED Provider Notes (Signed)
[facility] DEPT Provider Note   CSN: [id number] Arrival date & time: 07/02/19  2320     History   Chief Complaint Chief Complaint  Patient presents with  . Nausea    HPI Whitney Jenkins is a 43 y.o. female with a history of ulcerative colitis, vitamin B12 deficiency, GERD who presents to the emergency department with a chief complaint of nausea.  The patient reports that she has been having persistent nausea for the last 5 days.  She has been managing her symptoms at home with Dramamine.  When her symptoms initially began, she reports that she also had some muscle spasms and cramps in her bilateral lower legs and 2 episodes of nonbloody, nonbilious vomiting in addition to her nausea at home.  She went to [facility] ER where labs were drawn, but she left before being evaluated.  She reports that she called her primary care provider the next day and was told that her potassium was slightly low when she should start taking potassium supplementation.  She was also restarted on vitamin B12 supplementation.  She has had no further episodes of muscle spasms or cramping in her legs.    However, she reports that earlier tonight she began having intermittent muscle spasms  in her bilateral upper back. She also notes that she has been having some epigastric discomfort.  No significant change in flatus, burping, or belching.  No changes in her stools, diarrhea, or constipation.     The history is provided by the patient. No language interpreter was used.    Past Medical History:  Diagnosis Date  . Anemia    was on iron pills but states she has been off for a while  . Chronic chest wall pain   . Family history of breast cancer   . Family history of breast cancer in female   . Family history of pancreatic cancer   . FH: BRCA2 gene positive   . GERD (gastroesophageal reflux disease)    takes Omeprazole daily  . History of migraine    last one 2wks ago  . Leukopenia  12/11/2017  . Ulcerative colitis ([place])    Followed by Dr. Collene Mares  . Vitamin B12 deficiency 12/11/2017    Patient Active Problem List   Diagnosis Date Noted  . Leukopenia 12/11/2017  . Vitamin B12 deficiency 12/11/2017  . Ulcerative colitis without complications ([place]) 01/65/5374  . Smoker 12/10/2017  . Chronic chest wall pain   . Genetic testing 02/22/2015  . BRCA2 positive 02/22/2015  . FH: BRCA2 gene positive   . Family history of breast cancer   . Family history of breast cancer in female   . Family history of pancreatic cancer   . Chronic cholecystitis 02/17/2014    Past Surgical History:  Procedure Laterality Date  . CHOLECYSTECTOMY N/A 02/22/2014   Procedure: LAPAROSCOPIC CHOLECYSTECTOMY;  Surgeon: Harl Bowie, MD;  Location: [facility];  Service: General;  Laterality: N/A;  . KNEE ARTHROSCOPY Left   . LAPAROSCOPIC BILATERAL SALPINGO OOPHERECTOMY Bilateral 10/20/2018   Procedure: LAPAROSCOPIC BILATERAL SALPINGO OOPHORECTOMY;  Surgeon: Linda Hedges, DO;  Location: [facility];  Service: Gynecology;  Laterality: Bilateral;     OB History   No obstetric history on file.      Home Medications    Prior to Admission medications   Medication Sig Start Date End Date Taking? Authorizing Provider  acetaminophen (TYLENOL) 500 MG tablet Take 1,000 mg by mouth every 6 (six) hours as  needed for mild pain.    [provider]  cephALEXin (KEFLEX) 500 MG capsule Take 1 capsule (500 mg total) by mouth 4 (four) times daily for 5 days. 07/03/19 07/08/19  Destinee Taber A, PA-C  mesalamine (CANASA) 1000 MG suppository Place 1,000 mg rectally every other day.    [provider]  mesalamine (LIALDA) 1.2 g EC tablet Take 2.4 g by mouth daily with breakfast.     [provider]  methocarbamol (ROBAXIN) 500 MG tablet Take 1 tablet (500 mg total) by mouth 2 (two) times daily. 07/03/19   Zyaire Dumas A, PA-C  omeprazole (PRILOSEC OTC) 20 MG tablet Take 1  tablet by mouth daily as needed (heart burn).     [provider]  ondansetron (ZOFRAN ODT) 4 MG disintegrating tablet Take 1 tablet (4 mg total) by mouth every 8 (eight) hours as needed. 07/03/19   Braedyn Riggle A, PA-C    Family History Family History  Problem Relation Age of Onset  . Breast cancer Mother 59       recurrance at 15  . BRCA 1/2 Mother        BRCA2 +  . BRCA 1/2 Sister        BRCA2+  . Breast cancer Maternal Aunt   . Colon cancer Maternal Aunt   . Breast cancer Maternal Uncle   . Lung cancer Paternal Uncle   . Lung cancer Maternal Grandmother        smoker  . Lung cancer Maternal Grandfather        smoker  . Pancreatic cancer Maternal Uncle   . Stomach cancer Maternal Uncle     Social History Social History   Tobacco Use  . Smoking status: Current Every Day Smoker    Packs/day: 0.25    Years: 12.00    Pack years: 3.00    Types: Cigarettes    Last attempt to quit: 11/05/2015    Years since quitting: 3.6  . Smokeless tobacco: Never Used  Substance Use Topics  . Alcohol use: No  . Drug use: No     Allergies   Percocet [oxycodone-acetaminophen]   Review of Systems Review of Systems  Constitutional: Negative for activity change, chills and fever.  HENT: Negative for sore throat.   Respiratory: Negative for cough, shortness of breath and wheezing.   Cardiovascular: Negative for chest pain, palpitations and leg swelling.  Gastrointestinal: Positive for abdominal pain (epigastic), nausea and vomiting (resolved). Negative for abdominal distention, anal bleeding, blood in stool, constipation, diarrhea and rectal pain.  Genitourinary: Positive for dysuria. Negative for flank pain, frequency, hematuria, pelvic pain, urgency, vaginal bleeding, vaginal discharge and vaginal pain.  Musculoskeletal: Positive for back pain (spasms) and myalgias. Negative for arthralgias, joint swelling, neck pain and neck stiffness.  Skin: Negative for rash.   Allergic/Immunologic: Negative for immunocompromised state.  Neurological: Positive for headaches (resolved). Negative for dizziness, weakness, light-headedness and numbness.  Psychiatric/Behavioral: Negative for confusion.   Physical Exam Updated Vital Signs BP 113/76   Pulse (!) 50   Temp 98.3 F (36.8 C) (Oral)   Resp 17   Ht 5' 7"  (1.702 m)   Wt 63.5 kg   LMP 09/26/2018 (Approximate)   SpO2 100%   BMI 21.93 kg/m   Physical Exam Vitals signs and nursing note reviewed.  Constitutional:      General: She is not in acute distress. HENT:     Head: Normocephalic.  Eyes:     Conjunctiva/sclera: Conjunctivae normal.  Neck:     Musculoskeletal: Neck supple.  Cardiovascular:     Rate and Rhythm: Normal rate and regular rhythm.     Heart sounds: No murmur. No friction rub. No gallop.   Pulmonary:     Effort: Pulmonary effort is normal. No respiratory distress.  Abdominal:     General: There is no distension.     Palpations: Abdomen is soft. There is no mass.     Tenderness: There is abdominal tenderness. There is no right CVA tenderness, left CVA tenderness, guarding or rebound.     Hernia: No hernia is present.     Comments: Mild tenderness to palpation in the epigastric region without rebound or guarding.  Negative Murphy sign.  Abdomen is soft and nondistended.  Normoactive bowel sounds.   Skin:    General: Skin is warm.     Findings: No rash.  Neurological:     Mental Status: She is alert.  Psychiatric:        Behavior: Behavior normal.      ED Treatments / Results  Labs (all labs ordered are listed, but only abnormal results are displayed) Labs Reviewed  COMPREHENSIVE METABOLIC PANEL - Abnormal; Notable for the following components:      Result Value   Glucose, Bld 125 (*)    Total Protein 8.2 (*)    All other components within normal limits  URINALYSIS, ROUTINE W REFLEX MICROSCOPIC - Abnormal; Notable for the following components:   Leukocytes,Ua SMALL (*)     All other components within normal limits  URINALYSIS, MICROSCOPIC (REFLEX) - Abnormal; Notable for the following components:   Bacteria, UA RARE (*)    All other components within normal limits  URINE CULTURE  LIPASE, BLOOD  CBC  MAGNESIUM  CK    EKG None  Radiology No results found.  Procedures Procedures (including critical care time)  Medications Ordered in ED Medications  ondansetron (ZOFRAN-ODT) disintegrating tablet 4 mg (4 mg Oral Given 07/03/19 0336)  famotidine (PEPCID) tablet 20 mg (20 mg Oral Given 07/03/19 0336)  methocarbamol (ROBAXIN) tablet 500 mg (500 mg Oral Given 07/03/19 0336)     Initial Impression / Assessment and Plan / ED Course  I have reviewed the triage vital signs and the nursing notes.  Pertinent labs & imaging results that were available during my care of the patient were reviewed by me and considered in my medical decision making (see chart for details).        43 year old female with a history of ulcerative colitis, vitamin B12 deficiency, GERD presenting with nausea and muscle spasms, nausea, and epigastric pain.  Heart rate is normal.  There are several documented bradycardic pulses, but these were taken on Dinamap arms were noted to be within normal range when calculated manually.  She is normotensive, afebrile, and has no tachycardia or tachypnea.  Labs are unremarkable.  No evidence of metabolic derangements that could be the source of her spasms.  She did have some muscle spasms in the trapezius of the upper back on evaluation.  She has no chest pain, shortness of breath, URI symptoms.  Doubt atypical presentation of ACS, aortic dissection, or PE.  She has some mild epigastric discomfort on exam, but does not have rebound or guarding and does not of a surgical abdomen, but no evidence of pancreatitis given normal lipase and she has previously had cholecystectomy.  Low suspicion for ulcerative colitis flare.  She does have a small amount  of leukocyte esterase on urine.  This could be the source of her nausea.  Will send urine for culture and treat for UTI.  She will also be sent home with symptomatic treatment for nausea and Robaxin.  She has good follow-up with her PCP, and I have encouraged her to follow-up for reevaluation to ensure that her symptoms improve.  No further emergent work-up is indicated at this time.  At this time, she is hemodynamically stable and in no acute distress.  Safe for discharge to home with outpatient follow-up.  Final Clinical Impressions(s) / ED Diagnoses   Final diagnoses:  Muscle spasm of back  History of non anemic vitamin B12 deficiency  Acute cystitis without hematuria    ED Discharge Orders         Ordered    cephALEXin (KEFLEX) 500 MG capsule  4 times daily     07/03/19 0348    methocarbamol (ROBAXIN) 500 MG tablet  2 times daily     07/03/19 0348    ondansetron (ZOFRAN ODT) 4 MG disintegrating tablet  Every 8 hours PRN     07/03/19 0348           Zareth Rippetoe A, PA-C 07/03/19 4136    Varney Biles, MD 07/06/19 [phone]

## 2019-07-03 NOTE — Discharge Instructions (Addendum)
Thank you for allowing me to care for you today in the Emergency Department.   Let 1 tablet of Zofran dissolve under your tongue every 6 hours as needed for nausea.  Your urine looks concerning for developing urinary tract infection.  Take 1 tablet of Keflex every 6 hours for the next 5 days.  Make sure to take the entire course of antibiotics.  Your urine has been sent to the lab for culture.  For muscle spasms, you can take 1 tablet of Robaxin up to 2 times daily.  There is some over-the-counter muscle ache creams that may also help as well.  I would also recommend to continue to take your vitamin B supplementation.  Follow-up with primary care if your symptoms persist.  Make sure that you continue to drink plenty of fluids.  Return to the emergency department if you develop chest pain, respiratory distress, persistent vomiting despite taking Zofran, if you pass out, develop new numbness or weakness, or other new, concerning symptoms.

## 2019-07-04 LAB — URINE CULTURE: Culture: <10000 — AB

## 2019-07-05 ENCOUNTER — Ambulatory Visit (HOSPITAL_COMMUNITY)
Admission: EM | Admit: 2019-07-05 | Discharge: 2019-07-05 | Disposition: A | Discharge status: Home / Self Care | Payer: BC Managed Care – PPO | Attending: Emergency Medicine | Admitting: Emergency Medicine

## 2019-07-05 ENCOUNTER — Other Ambulatory Visit: Payer: Self-pay

## 2019-07-05 DIAGNOSIS — R0789 Other chest pain: Secondary | ICD-10-CM

## 2019-07-05 NOTE — ED Triage Notes (Signed)
Pt here with mid sternal CP with hx of similar with eval recently

## 2019-07-05 NOTE — ED Provider Notes (Signed)
Whitney Jenkins    CSN: [id number] Arrival date & time: 07/05/19  1948      History   Chief Complaint Chief Complaint  Patient presents with   Chest Pain    HPI Whitney Jenkins is a 43 y.o. female.   Patient presents with chest wall pain earlier today, which has now resolved.  She denies current chest pain, shortness of breath, dizziness, palpitations, weakness, or other symptoms.  She was seen at [facility] ED on 07/02/2019; she is currently being treated for UTI.  The history is provided by the patient.    Past Medical History:  Diagnosis Date   Anemia    was on iron pills but states she has been off for a while   Chronic chest wall pain    Family history of breast cancer    Family history of breast cancer in female    Family history of pancreatic cancer    FH: BRCA2 gene positive    GERD (gastroesophageal reflux disease)    takes Omeprazole daily   History of migraine    last one 2wks ago   Leukopenia 12/11/2017   Ulcerative colitis ([place])    Followed by Dr. Collene Mares   Vitamin B12 deficiency 12/11/2017    Patient Active Problem List   Diagnosis Date Noted   Leukopenia 12/11/2017   Vitamin B12 deficiency 12/11/2017   Ulcerative colitis without complications ([place]) 21/97/5883   Smoker 12/10/2017   Chronic chest wall pain    Genetic testing 02/22/2015   BRCA2 positive 02/22/2015   FH: BRCA2 gene positive    Family history of breast cancer    Family history of breast cancer in female    Family history of pancreatic cancer    Chronic cholecystitis 02/17/2014    Past Surgical History:  Procedure Laterality Date   CHOLECYSTECTOMY N/A 02/22/2014   Procedure: LAPAROSCOPIC CHOLECYSTECTOMY;  Surgeon: Harl Bowie, MD;  Location: [facility];  Service: General;  Laterality: N/A;   KNEE ARTHROSCOPY Left    LAPAROSCOPIC BILATERAL SALPINGO OOPHERECTOMY Bilateral 10/20/2018   Procedure: LAPAROSCOPIC BILATERAL SALPINGO OOPHORECTOMY;  Surgeon: Linda Hedges, DO;  Location: [facility];  Service: Gynecology;  Laterality: Bilateral;    OB History   No obstetric history on file.      Home Medications    Prior to Admission medications   Medication Sig Start Date End Date Taking? Authorizing Provider  acetaminophen (TYLENOL) 500 MG tablet Take 1,000 mg by mouth every 6 (six) hours as needed for mild pain.    [provider]  cephALEXin (KEFLEX) 500 MG capsule Take 1 capsule (500 mg total) by mouth 4 (four) times daily for 5 days. 07/03/19 07/08/19  McDonald, Mia A, PA-C  mesalamine (CANASA) 1000 MG suppository Place 1,000 mg rectally every other day.    [provider]  mesalamine (LIALDA) 1.2 g EC tablet Take 2.4 g by mouth daily with breakfast.     [provider]  methocarbamol (ROBAXIN) 500 MG tablet Take 1 tablet (500 mg total) by mouth 2 (two) times daily. 07/03/19   McDonald, Mia A, PA-C  omeprazole (PRILOSEC OTC) 20 MG tablet Take 1 tablet by mouth daily as needed (heart burn).     [provider]  ondansetron (ZOFRAN ODT) 4 MG disintegrating tablet Take 1 tablet (4 mg total) by mouth every 8 (eight) hours as needed. 07/03/19   McDonald, Laymond Purser, PA-C    Family History Family History  Problem Relation Age of  Onset   Breast cancer Mother 53       recurrance at 90   BRCA 1/2 Mother        BRCA2 +   BRCA 1/2 Sister        BRCA2+   Breast cancer Maternal Aunt    Colon cancer Maternal Aunt    Breast cancer Maternal Uncle    Lung cancer Paternal Uncle    Lung cancer Maternal Grandmother        smoker   Lung cancer Maternal Grandfather        smoker   Pancreatic cancer Maternal Uncle    Stomach cancer Maternal Uncle     Social History Social History   Tobacco Use   Smoking status: Current Every Day Smoker    Packs/day: 0.25    Years: 12.00    Pack years: 3.00    Types: Cigarettes    Last attempt to quit: 11/05/2015    Years since quitting: 3.6   Smokeless  tobacco: Never Used  Substance Use Topics   Alcohol use: No   Drug use: No     Allergies   Percocet [oxycodone-acetaminophen]   Review of Systems Review of Systems  Constitutional: Negative for chills and fever.  HENT: Negative for ear pain and sore throat.   Eyes: Negative for pain and visual disturbance.  Respiratory: Negative for cough and shortness of breath.   Cardiovascular: Positive for chest pain. Negative for palpitations and leg swelling.  Gastrointestinal: Negative for abdominal pain and vomiting.  Genitourinary: Negative for dysuria and hematuria.  Musculoskeletal: Negative for arthralgias and back pain.  Skin: Negative for color change and rash.  Neurological: Negative for seizures and syncope.  All other systems reviewed and are negative.    Physical Exam Triage Vital Signs ED Triage Vitals  Enc Vitals Group     BP      Pulse      Resp      Temp      Temp src      SpO2      Weight      Height      Head Circumference      Peak Flow      Pain Score      Pain Loc      Pain Edu?      Excl. in [place]?    No data found.  Updated Vital Signs BP 130/79 (BP Location: Left Arm)    Pulse 75    Temp 98.1 F (36.7 C) (Temporal)    Resp 16    LMP 09/26/2018 (Approximate)    SpO2 99%   Visual Acuity Right Eye Distance:   Left Eye Distance:   Bilateral Distance:    Right Eye Near:   Left Eye Near:    Bilateral Near:     Physical Exam Vitals signs and nursing note reviewed.  Constitutional:      General: She is not in acute distress.    Appearance: She is well-developed. She is not ill-appearing.  HENT:     Head: Normocephalic and atraumatic.     Mouth/Throat:     Mouth: Mucous membranes are moist.     Pharynx: Oropharynx is clear.  Eyes:     Conjunctiva/sclera: Conjunctivae normal.  Neck:     Musculoskeletal: Neck supple.  Cardiovascular:     Rate and Rhythm: Normal rate and regular rhythm.     Heart sounds: Normal heart sounds. No murmur.    Pulmonary:  Effort: Pulmonary effort is normal. No respiratory distress.     Breath sounds: Normal breath sounds.  Abdominal:     Palpations: Abdomen is soft.     Tenderness: There is no abdominal tenderness. There is no guarding or rebound.  Skin:    General: Skin is warm and dry.  Neurological:     General: No focal deficit present.     Mental Status: She is alert and oriented to person, place, and time.     Sensory: No sensory deficit.     Motor: No weakness.  Psychiatric:        Mood and Affect: Mood normal.        Behavior: Behavior normal.      UC Treatments / Results  Labs (all labs ordered are listed, but only abnormal results are displayed) Labs Reviewed - No data to display  EKG   Radiology No results found.  Procedures Procedures (including critical care time)  Medications Ordered in UC Medications - No data to display  Initial Impression / Assessment and Plan / UC Course  I have reviewed the triage vital signs and the nursing notes.  Pertinent labs & imaging results that were available during my care of the patient were reviewed by me and considered in my medical decision making (see chart for details).    Atypical chest pain.  Patient is currently asymptomatic.  EKG shows normal sinus rhythm with a rate of 80, no ST elevation, compared to previous EKG. instructed patient to go to the emergency department if she has chest pain, shortness of breath, heart palpitations, dizziness, or other concerning symptoms.  Patient agrees to plan of care.     Final Clinical Impressions(s) / UC Diagnoses   Final diagnoses:  Atypical chest pain     Discharge Instructions     Go to the emergency department if you have chest pain, shortness of breath, heart palpitations, dizziness, or other concerning symptoms.        ED Prescriptions    None     I have reviewed the PDMP during this encounter.   Sharion Balloon, NP 07/05/19 2028

## 2019-07-05 NOTE — Discharge Instructions (Addendum)
Go to the emergency department if you have chest pain, shortness of breath, heart palpitations, dizziness, or other concerning symptoms.

## 2019-07-07 DIAGNOSIS — K5904 Chronic idiopathic constipation: Secondary | ICD-10-CM | POA: Diagnosis not present

## 2019-07-07 DIAGNOSIS — R1013 Epigastric pain: Secondary | ICD-10-CM | POA: Diagnosis not present

## 2019-07-07 DIAGNOSIS — K5190 Ulcerative colitis, unspecified, without complications: Secondary | ICD-10-CM | POA: Diagnosis not present

## 2019-07-07 DIAGNOSIS — K219 Gastro-esophageal reflux disease without esophagitis: Secondary | ICD-10-CM | POA: Diagnosis not present

## 2019-07-28 DIAGNOSIS — Z01419 Encounter for gynecological examination (general) (routine) without abnormal findings: Secondary | ICD-10-CM | POA: Diagnosis not present

## 2019-07-28 DIAGNOSIS — Z1231 Encounter for screening mammogram for malignant neoplasm of breast: Secondary | ICD-10-CM | POA: Diagnosis not present

## 2019-07-28 DIAGNOSIS — Z6821 Body mass index (BMI) 21.0-21.9, adult: Secondary | ICD-10-CM | POA: Diagnosis not present

## 2019-08-11 DIAGNOSIS — H40013 Open angle with borderline findings, low risk, bilateral: Secondary | ICD-10-CM | POA: Diagnosis not present

## 2019-08-12 DIAGNOSIS — H40013 Open angle with borderline findings, low risk, bilateral: Secondary | ICD-10-CM | POA: Diagnosis not present

## 2019-08-30 ENCOUNTER — Telehealth: Payer: Self-pay

## 2019-08-30 NOTE — Telephone Encounter (Signed)
PT LVM WANTING NEW PT APPT [facility] WHICH AT THIS TIME NOT ACCEPTING NEW PTS. ATT TO CALL PT TO ADVISE SHE IS WELCOME TO [facility] W/NP OR PA FOR NEW PT APPT NO ANS LVM

## 2019-10-28 ENCOUNTER — Ambulatory Visit (INDEPENDENT_AMBULATORY_CARE_PROVIDER_SITE_OTHER): Payer: BC Managed Care – PPO | Admitting: Family Medicine

## 2019-10-28 ENCOUNTER — Encounter: Payer: Self-pay | Admitting: Family Medicine

## 2019-10-28 ENCOUNTER — Other Ambulatory Visit: Payer: Self-pay

## 2019-10-28 VITALS — BP 122/82 | HR 52 | Temp 98.2°F | Wt 151.2 lb

## 2019-10-28 DIAGNOSIS — R519 Headache, unspecified: Secondary | ICD-10-CM

## 2019-10-28 DIAGNOSIS — N921 Excessive and frequent menstruation with irregular cycle: Secondary | ICD-10-CM | POA: Diagnosis not present

## 2019-10-28 DIAGNOSIS — R0789 Other chest pain: Secondary | ICD-10-CM

## 2019-10-28 MED ORDER — TRAMADOL HCL 50 MG PO TABS: 50.0000 mg | ORAL_TABLET | Freq: Two times a day (BID) | ORAL | 0 refills | Status: AC | PRN

## 2019-10-28 NOTE — Patient Instructions (Signed)
Chest Wall Pain Chest wall pain is pain in or around the bones and muscles of your chest. Sometimes, an injury causes this pain. Excessive coughing or overuse of arm and chest muscles may also cause chest wall pain. Sometimes, the cause may not be known. This pain may take several weeks or longer to get better. Follow these instructions at home: Managing pain, stiffness, and swelling   If directed, put ice on the painful area: ? Put ice in a plastic bag. ? Place a towel between your skin and the bag. ? Leave the ice on for 20 minutes, 2-3 times per day. Activity  Rest as told by your health care provider.  Avoid activities that cause pain. These include any activities that use your chest muscles or your abdominal and side muscles to lift heavy items. Ask your health care provider what activities are safe for you. General instructions   Take over-the-counter and prescription medicines only as told by your health care provider.  Do not use any products that contain nicotine or tobacco, such as cigarettes, e-cigarettes, and chewing tobacco. These can delay healing after injury. If you need help quitting, ask your health care provider.  Keep all follow-up visits as told by your health care provider. This is important. Contact a health care provider if:  You have a fever.  Your chest pain becomes worse.  You have new symptoms. Get help right away if:  You have nausea or vomiting.  You feel sweaty or light-headed.  You have a cough with mucus from your lungs (sputum) or you cough up blood.  You develop shortness of breath. These symptoms may represent a serious problem that is an emergency. Do not wait to see if the symptoms will go away. Get medical help right away. Call your local emergency services (911 in the [place] Do not drive yourself to the hospital. Summary  Chest wall pain is pain in or around the bones and muscles of your chest.  Depending on the cause, it may be  treated with ice, rest, medicines, and avoiding activities that cause pain.  Contact a health care provider if you have a fever, worsening chest pain, or new symptoms.  Get help right away if you feel light-headed or you develop shortness of breath. These symptoms may be an emergency. This information is not intended to replace advice given to you by your health care provider. Make sure you discuss any questions you have with your health care provider. Document Revised: 03/18/2018 Document Reviewed: 03/18/2018 Elsevier Patient Education  2020 [employer].

## 2019-10-28 NOTE — Progress Notes (Signed)
Subjective:    Patient ID: Whitney Jenkins, female    DOB: 03-06-1976, 44 y.o.   MRN: [id number]  HPI Chief Complaint  Patient presents with  . headache    headache x2 days. every time when changing patchs, has reached out to obgyn, muscle wall cramps chest pain   States she has headaches that come on every time she puts on a new birth control patch.  States she and her OB/GYN have been working on this to help control her menstrual cycles.  Reports bleeding heavily and irregular periods since starting on the patch in October.  States she plans to follow-up with her OB/GYN about switching birth control again.  She has taken birth control pills in the past but was unable to keep a regular schedule and take them consistently.  Denies fever, chills, night sweats, unexplained weight loss, fatigue, dizziness, palpitations, shortness of breath, cough, abdominal pain, nausea, vomiting, diarrhea, urinary symptoms, LE edema.   She also complains of recurrent chest wall pain.  She has been worked up for this in the past on multiple occasions including in the emergency department recently.  States she has always had normal EKGs and chest x-rays. Pain is anterior and only present on days when she is working and has to do heavy lifting.  States when she has several days off in a row she does not have pain. She has been struggling with this for a long time intermittently.   States she has ulcerative colitis and is not supposed to take NSAIDs. Tylenol is helping.   Denies personal or family history of RA, Lupus other autoimmune issues.   Reviewed allergies, medications, past medical, surgical, family, and social history.    Review of Systems Pertinent positives and negatives in the history of present illness.     Objective:   Physical Exam Constitutional:      General: She is not in acute distress.    Appearance: Normal appearance. She is not ill-appearing.  Eyes:     General: Lids are normal.      Extraocular Movements: Extraocular movements intact.     Conjunctiva/sclera: Conjunctivae normal.     Pupils: Pupils are equal, round, and reactive to light.  Cardiovascular:     Rate and Rhythm: Normal rate and regular rhythm.     Pulses: Normal pulses.     Heart sounds: Normal heart sounds. No murmur. No friction rub. No gallop.   Pulmonary:     Effort: Pulmonary effort is normal. No accessory muscle usage.     Breath sounds: Normal breath sounds.  Chest:     Chest wall: Tenderness present. No mass or deformity.  Musculoskeletal:     Cervical back: Normal range of motion and neck supple.     Right lower leg: No edema.     Left lower leg: No edema.  Skin:    General: Skin is warm and dry.     Capillary Refill: Capillary refill takes less than 2 seconds.     Coloration: Skin is not pale.  Neurological:     General: No focal deficit present.     Mental Status: She is alert and oriented to person, place, and time.     Cranial Nerves: Cranial nerves are intact.     Sensory: Sensation is intact.    BP 122/82   Pulse (!) 52   Temp 98.2 F (36.8 C)   Wt 151 lb 3.2 oz (68.6 kg)   LMP 09/26/2018 (Approximate)  BMI 23.68 kg/m       Assessment & Plan:  Musculoskeletal chest pain - Plan: CBC with Differential/Platelet, Comprehensive metabolic panel, Sedimentation Rate, ANA, Ambulatory referral to Orthopedic Surgery, traMADol (ULTRAM) 50 MG tablet -She has been dealing with intermittent musculoskeletal chest pain for several months.  She has had normal EKGs and chest x-rays.  Her pain does not appear to be related to a cardiovascular issue.  Pain is worse with heavy lifting but resolves when she is able to take time off from work and not performing strenuous upper body activities.  She has severe tenderness to palpation today.  She is unable to take NSAIDs due to ulcerative colitis.  Tylenol is no longer helping.  I will prescribe tramadol short-term and refer her to Ortho for further  evaluation and recommendations. Check for underlying autoimmune issue, follow up pending labs   Menorrhagia with irregular cycle - Plan: CBC with Differential/Platelet -screen for anemia and follow up with OB/GYN to consider an alternative birth control.   Nonintractable episodic headache, unspecified headache type

## 2019-10-29 LAB — COMPREHENSIVE METABOLIC PANEL
ALT: 11 IU/L (ref 0–32)
AST: 17 IU/L (ref 0–40)
Albumin/Globulin Ratio: 1.5 (ref 1.2–2.2)
Albumin: 4.1 g/dL (ref 3.8–4.8)
Alkaline Phosphatase: 39 IU/L (ref 39–117)
BUN/Creatinine Ratio: 7 — ABNORMAL LOW (ref 9–23)
BUN: 5 mg/dL — ABNORMAL LOW (ref 6–24)
Bilirubin Total: 0.3 mg/dL (ref 0.0–1.2)
CO2: 23 mmol/L (ref 20–29)
Calcium: 8.9 mg/dL (ref 8.7–10.2)
Chloride: 104 mmol/L (ref 96–106)
Creatinine, Ser: 0.7 mg/dL (ref 0.57–1.00)
GFR calc Af Amer: 124 mL/min/{1.73_m2} (ref >59)
GFR calc non Af Amer: 107 mL/min/{1.73_m2} (ref >59)
Globulin, Total: 2.8 g/dL (ref 1.5–4.5)
Glucose: 91 mg/dL (ref 65–99)
Potassium: 4.2 mmol/L (ref 3.5–5.2)
Sodium: 139 mmol/L (ref 134–144)
Total Protein: 6.9 g/dL (ref 6.0–8.5)

## 2019-10-29 LAB — CBC WITH DIFFERENTIAL/PLATELET
Basophils Absolute: 0 10*3/uL (ref 0.0–0.2)
Basos: 1 %
EOS (ABSOLUTE): 0.1 10*3/uL (ref 0.0–0.4)
Eos: 2 %
Hematocrit: 37.9 % (ref 34.0–46.6)
Hemoglobin: 12.4 g/dL (ref 11.1–15.9)
Immature Grans (Abs): 0 10*3/uL (ref 0.0–0.1)
Immature Granulocytes: 0 %
Lymphocytes Absolute: 1.8 10*3/uL (ref 0.7–3.1)
Lymphs: 33 %
MCH: 30.5 pg (ref 26.6–33.0)
MCHC: 32.7 g/dL (ref 31.5–35.7)
MCV: 93 fL (ref 79–97)
Monocytes Absolute: 0.4 10*3/uL (ref 0.1–0.9)
Monocytes: 8 %
Neutrophils Absolute: 3 10*3/uL (ref 1.4–7.0)
Neutrophils: 56 %
Platelets: 202 10*3/uL (ref 150–450)
RBC: 4.06 x10E6/uL (ref 3.77–5.28)
RDW: 11.2 % — ABNORMAL LOW (ref 11.7–15.4)
WBC: 5.4 10*3/uL (ref 3.4–10.8)

## 2019-10-29 LAB — SEDIMENTATION RATE: Sed Rate: 5 mm/hr (ref 0–32)

## 2019-10-29 LAB — ANA: Anti Nuclear Antibody (ANA): NEGATIVE

## 2019-11-02 ENCOUNTER — Encounter: Payer: Self-pay | Admitting: Family Medicine

## 2019-11-02 ENCOUNTER — Ambulatory Visit (INDEPENDENT_AMBULATORY_CARE_PROVIDER_SITE_OTHER): Payer: BC Managed Care – PPO | Admitting: Family Medicine

## 2019-11-02 ENCOUNTER — Other Ambulatory Visit: Payer: Self-pay

## 2019-11-02 DIAGNOSIS — R0789 Other chest pain: Secondary | ICD-10-CM

## 2019-11-02 DIAGNOSIS — G8929 Other chronic pain: Secondary | ICD-10-CM | POA: Diagnosis not present

## 2019-11-02 NOTE — Progress Notes (Signed)
Office Visit Note   Patient: Whitney Jenkins           Date of Birth: 01-Sep-1976           MRN: [id number] Visit Date: 11/02/2019 Requested by: Girtha Rm, NP-C [place],  [place] [id number] PCP: Girtha Rm, NP-C  Subjective: Chief Complaint  Patient presents with  . Chest - Pain    Pain across upper chest x at least 6 months. Notices it mainly after work in the evenings. Taking Tylenol, 2 bid. Cannot take NSAIDs due to ulcerative colitis. Been using heat - was advised by PCP to try ice - not tried yet.    HPI: She is here with chest wall pain.  Intermittent symptoms for at least 6 to 12 months, no definite injury.  She works at [employer] and has done so for the past 6 years.  There is nothing new about the type of work she is doing, but she notes that her pain seems to have been at the end of a shift after doing a lot of repetitive activities, especially upper body twisting.  On her way home she feels aching pain in her chest wall and it is very tender to touch.  She has had cardiac work-up in the ER which was negative.  She has had MRI of breasts with left breast biopsy, these results were negative.  She has tried ibuprofen but it caused a flareup of ulcerative colitis.  She has had ulcerative colitis for the past several years.  She has a family history of osteoporosis.  Patient has never had a stress fracture.  She has a history of iron deficiency anemia and B12 deficiency and both of these are being treated.              ROS: No fevers or chills.  All other systems were reviewed and are negative.  Objective: Vital Signs: LMP 09/26/2018 (Approximate)   Physical Exam:  General:  Alert and oriented, in no acute distress. Pulm:  Breathing unlabored. Psy:  Normal mood, congruent affect. Skin: No visible rash. Chest wall: Female chaperone was present.  She is tender to palpation in the pectoralis musculature on the left greater than the right, near the sternum.   No distinct nodules or trigger points.  She has full range of motion of the neck and shoulders but it hurts to reach overhead with her shoulders.  She has pain with flexion of the pectoralis muscles against resistance.  Imaging: None today  Assessment & Plan: 1.  Chronic intermittent chest wall pain -Check vitamin D level and if deficient, we will treat aggressively.  This could potentially be a source of pain. -If vitamin D is normal, then we will try physical therapy.  Could contemplate muscle relaxant to take in the evenings as needed.     Procedures: No procedures performed  No notes on file     PMFS History: Patient Active Problem List   Diagnosis Date Noted  . Leukopenia 12/11/2017  . Vitamin B12 deficiency 12/11/2017  . Ulcerative colitis without complications ([facility]) 28/36/6294  . Smoker 12/10/2017  . Chronic chest wall pain   . Uterine fibroid 11/06/2015  . Genetic testing 02/22/2015  . BRCA2 positive 02/22/2015  . FH: BRCA2 gene positive   . Family history of breast cancer   . Family history of breast cancer in female   . Family history of pancreatic cancer   . Chronic cholecystitis 02/17/2014   Past Medical  History:  Diagnosis Date  . Anemia    was on iron pills but states she has been off for a while  . Chronic chest wall pain   . Family history of breast cancer   . Family history of breast cancer in female   . Family history of pancreatic cancer   . FH: BRCA2 gene positive   . GERD (gastroesophageal reflux disease)    takes Omeprazole daily  . History of migraine    last one 2wks ago  . Leukopenia 12/11/2017  . Ulcerative colitis ([facility])    Followed by Dr. Collene Mares  . Vitamin B12 deficiency 12/11/2017    Family History  Problem Relation Age of Onset  . Breast cancer Mother 21       recurrance at 42  . BRCA 1/2 Mother        BRCA2 +  . BRCA 1/2 Sister        BRCA2+  . Breast cancer Maternal Aunt   . Colon cancer Maternal Aunt   . Breast cancer Maternal  Uncle   . Lung cancer Paternal Uncle   . Lung cancer Maternal Grandmother        smoker  . Lung cancer Maternal Grandfather        smoker  . Pancreatic cancer Maternal Uncle   . Stomach cancer Maternal Uncle     Past Surgical History:  Procedure Laterality Date  . CHOLECYSTECTOMY N/A 02/22/2014   Procedure: LAPAROSCOPIC CHOLECYSTECTOMY;  Surgeon: Harl Bowie, MD;  Location: [facility];  Service: General;  Laterality: N/A;  . KNEE ARTHROSCOPY Left   . LAPAROSCOPIC BILATERAL SALPINGO OOPHERECTOMY Bilateral 10/20/2018   Procedure: LAPAROSCOPIC BILATERAL SALPINGO OOPHORECTOMY;  Surgeon: Linda Hedges, DO;  Location: [facility];  Service: Gynecology;  Laterality: Bilateral;   Social History   Occupational History  . Not on file  Tobacco Use  . Smoking status: Current Every Day Smoker    Packs/day: 0.25    Years: 12.00    Pack years: 3.00    Types: Cigarettes    Last attempt to quit: 11/05/2015    Years since quitting: 3.9  . Smokeless tobacco: Never Used  Substance and Sexual Activity  . Alcohol use: No  . Drug use: No  . Sexual activity: Not Currently

## 2019-11-03 ENCOUNTER — Telehealth: Payer: Self-pay | Admitting: Family Medicine

## 2019-11-03 DIAGNOSIS — G8929 Other chronic pain: Secondary | ICD-10-CM

## 2019-11-03 LAB — VITAMIN D 25 HYDROXY (VIT D DEFICIENCY, FRACTURES): Vit D, 25-Hydroxy: 28 ng/mL — ABNORMAL LOW (ref 30–100)

## 2019-11-03 NOTE — Telephone Encounter (Signed)
Vitamin D is low at 28 (goal level is 50-80).    I recommend taking vitamin D3 at 5,000 IU daily.  Also, take vitamin K2 at 100 mcg daily and magnesium at 400 mg daily.  We will recheck levels in 3 months to be sure it's in range.  You may need to take this regimen indefinitely (it's good for bone health and immune function).  This could be contributing to your pain.  I also think physical therapy would be useful, so I will request referral.

## 2019-11-16 ENCOUNTER — Ambulatory Visit: Payer: BC Managed Care – PPO | Attending: Family Medicine | Admitting: Physical Therapy

## 2019-11-16 ENCOUNTER — Encounter: Payer: Self-pay | Admitting: Physical Therapy

## 2019-11-16 ENCOUNTER — Other Ambulatory Visit: Payer: Self-pay

## 2019-11-16 DIAGNOSIS — M25511 Pain in right shoulder: Secondary | ICD-10-CM | POA: Diagnosis not present

## 2019-11-16 DIAGNOSIS — R0789 Other chest pain: Secondary | ICD-10-CM | POA: Insufficient documentation

## 2019-11-16 DIAGNOSIS — N939 Abnormal uterine and vaginal bleeding, unspecified: Secondary | ICD-10-CM | POA: Diagnosis not present

## 2019-11-16 DIAGNOSIS — M25512 Pain in left shoulder: Secondary | ICD-10-CM | POA: Diagnosis not present

## 2019-11-16 DIAGNOSIS — M6281 Muscle weakness (generalized): Secondary | ICD-10-CM | POA: Diagnosis not present

## 2019-11-16 DIAGNOSIS — G8929 Other chronic pain: Secondary | ICD-10-CM | POA: Insufficient documentation

## 2019-11-16 NOTE — Patient Instructions (Addendum)
[facility] e: [email or web address] URL: [email or web address] Date: 11/16/2019 Prepared by: Kearney Hard  Exercises Seated Gentle Upper Trapezius Stretch - 5 reps - 10-20 seconds hold - 3x daily - 7x weekly Gentle Levator Scapulae Stretch - 5 reps - 10-20 seconds hold - 3x daily - 7x weekly Open Book Chest Stretch on Towel Roll - 5 reps - 10--20 seconds hold - 3x daily - 7x weekly Seated Shoulder Scaption Slide at Table Top with Forearm in Neutral - 5 reps - 2-3 seconds hold - 3x daily - 7x weekly Doorway Pec Stretch at 90 Degrees Abduction - 5 reps - 10-20 seconds hold - 3x daily - 7x weekly Doorway Pec Stretch at 60 Elevation - 5 reps - 10-20 seconds hold - 1x daily - 7x weekly

## 2019-11-16 NOTE — Therapy (Signed)
[facility] [address] [place], , [address] Phone: [phone]   Fax:  [phone]  Physical Therapy Evaluation  Patient Details  Name: Whitney Jenkins MRN: [id number] Date of Birth: 11/09/1975 Referring Provider (PT): Michael Hilts, MD   Encounter Date: 11/16/2019  PT End of Session - 11/16/19 1403    Visit Number  1    Number of Visits  6    Date for PT Re-Evaluation  01/13/20    PT Start Time  1400    PT Stop Time  1445    PT Time Calculation (min)  45 min    Activity Tolerance  Patient tolerated treatment well    Behavior During Therapy  WFL for tasks assessed/performed       Past Medical History:  Diagnosis Date  . Anemia    was on iron pills but states she has been off for a while  . Chronic chest wall pain   . Family history of breast cancer   . Family history of breast cancer in female   . Family history of pancreatic cancer   . FH: BRCA2 gene positive   . GERD (gastroesophageal reflux disease)    takes Omeprazole daily  . History of migraine    last one 2wks ago  . Leukopenia 12/11/2017  . Ulcerative colitis ([facility])    Followed by Dr. Mann  . Vitamin B12 deficiency 12/11/2017    Past Surgical History:  Procedure Laterality Date  . CHOLECYSTECTOMY N/A 02/22/2014   Procedure: LAPAROSCOPIC CHOLECYSTECTOMY;  Surgeon: Douglas A Blackman, MD;  Location[facility];  Service: General;  Laterality: N/A;  . KNEE ARTHROSCOPY Left   . LAPAROSCOPIC BILATERAL SALPINGO OOPHERECTOMY Bilateral 10/20/2018   Procedure: LAPAROSCOPIC BILATERAL SALPINGO OOPHORECTOMY;  Surgeon: Morris, Megan, DO;  Location: [facility];  Service: Gynecology;  Laterality: Bilateral;    There were no vitals filed for this visit.   Subjective Assessment - 11/16/19 1359    Subjective  Pt arriving to therapy reporting chest wall pain that gets worse following work. Pt states she is unable to take ibuprofen due to ulcerative colitis.  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 Pt reporting when she tries to relax after work her pain increases to 9/10. Pt reporting ER visit last October 2020.    Pertinent History  ulcerative colitis, GERD, BRCA 2 Positive, Vit B 12 deficiency    Currently in Pain?  No/denies         [facility] PT Assessment - 11/16/19 0001      Assessment   Medical Diagnosis  chest pain, bilateral shoulder pain L>R.     Referring Provider (PT)  Michael Hilts, MD    Onset Date/Surgical Date  --   about a year ago   Hand Dominance  Right    Prior Therapy  no      Precautions   Precautions  None      Restrictions   Weight Bearing Restrictions  No      Balance Screen   Has the patient fallen in the past 6 months  No    Is the patient reluctant to leave their home because of a fear of falling?   No      Home Environment   Living Environment  Private residence      Prior Function   Level of Independence  Independent    Vocation  Full time employment      Vocation Engineer, technical sales at [employer]  played basketball at [employer]   Overall Cognitive Status  Within Functional Limits for tasks assessed      Posture/Postural Control   Posture/Postural Control  Postural limitations    Postural Limitations  Rounded Shoulders;Forward head      ROM / Strength   AROM / PROM / Strength  AROM;Strength      AROM   Overall AROM Comments  R: grip (60, 60 60 ppsi), L grip: (55, 60, 65 ppsi)    AROM Assessment Site  Shoulder    Right/Left Shoulder  Right;Left    Right Shoulder Extension  70 Degrees    Right Shoulder Flexion  162 Degrees    Right Shoulder ABduction  160 Degrees    Right Shoulder Internal Rotation  --   thumb to T10   Right Shoulder External Rotation  75 Degrees    Left Shoulder Extension  52 Degrees   limited by pain   Left Shoulder Flexion  158 Degrees    Left Shoulder ABduction  134 Degrees   limited by pain, anterior upper  chest   Left Shoulder Internal Rotation  --   thumb to T12   Left Shoulder External Rotation  72 Degrees      Strength   Overall Strength  Deficits    Overall Strength Comments  L UE limited by pain    Strength Assessment Site  Shoulder    Right/Left Shoulder  Right;Left    Right Shoulder Flexion  4+/5    Right Shoulder Extension  4+/5    Right Shoulder Internal Rotation  4+/5    Right Shoulder External Rotation  4+/5    Left Shoulder Flexion  4-/5    Left Shoulder Extension  4-/5    Left Shoulder Internal Rotation  4-/5    Left Shoulder External Rotation  4-/5      Palpation   Palpation comment  TTP bilateral Upper traps, scalenes, pectoralis major       Special Tests    Special Tests  Rotator Cuff Impingement    Rotator Cuff Impingment tests  Empty Can test;Full Can test      Empty Can test   Findings  Positive    Side  Left    Comment  Positive on R       Full Can test   Findings  Positive    Side  Left      Transfers   Transfers  Independent with all Transfers      Ambulation/Gait   Gait Pattern  Within Functional Limits                Objective measurements completed on examination: See above findings.              PT Education - 11/16/19 1403    Education Details  HEP    Person(s) Educated  Patient    Methods  Explanation;Handout    Comprehension  Verbalized understanding;Returned demonstration          PT Long Term Goals - 11/16/19 1452      PT LONG TERM GOAL #1   Title  Pt will be independent in her HEP and progression.    Baseline  initial progam issued on 11/16/2019    Time  8    Period  Weeks    Status  New    Target Date  01/13/20  PT LONG TERM GOAL #2   Title  Pt will improve her L UE shoulder flexion to >/= 160 degrees with pain <= 4/10.    Time  8    Period  Weeks    Status  New    Target Date  01/13/20      PT LONG TERM GOAL #3   Title  Pt will be able to report pain </= 4/10 following a full shift at  work.    Baseline  9/10 to 10/10.    Time  8    Period  Weeks    Status  New    Target Date  01/13/20      PT LONG TERM GOAL #4   Title  Pt will improve L shoulder abduction and extension by >/= 15 degrees  with pain </= 3/10.    Baseline  see flowsheets    Time  8    Period  Weeks    Status  New    Target Date  01/13/20      PT LONG TERM GOAL #5   Title  Pt will be able to lift a 20# object from floor to over head shelf with pain </= 3/10.    Time  8    Period  Weeks    Status  New    Target Date  01/13/20             Plan - 11/16/19 1404    Clinical Impression Statement  Pt arriving to therapy reporting chest pain bilaterally. Pt also stating that pain can radiate down her L UE and she has difficulty lifting and reaching. Pt with positive Empty Can and Full Can test on left and positive Empty Can on R. Pt with tenderness noted in bilateral pectoralis major, upper traps, and scalenes. We discussed trigger point DN for future visit. Pt was issued a HEP for gentle stretching. Recommending skilled PT to progress pt toward PLOF and improved mobility.    Personal Factors and Comorbidities  Comorbidity 2    Comorbidities  ulerative colitis, GERD, 2 left breast biopsies, BRCA 2 positive    Examination-Activity Limitations  Carry;Lift;Reach Overhead    Examination-Participation Restrictions  Other    Stability/Clinical Decision Making  Stable/Uncomplicated    Clinical Decision Making  Low    Rehab Potential  Good    PT Frequency  1x / week    PT Duration  6 weeks    PT Treatment/Interventions  ADLs/Self Care Home Management;Cryotherapy;Ultrasound;Traction;Moist Heat;Iontophoresis 4mg/ml Dexamethasone;Electrical Stimulation;Therapeutic activities;Therapeutic exercise;Neuromuscular re-education;Functional mobility training;Stair training;Gait training;Manual techniques;Patient/family education;Taping;Passive range of motion;Dry needling    PT Next Visit Plan  stretches, UE  strengthening, modalities, STM    PT Home Exercise Plan  [id number]    Consulted and Agree with Plan of Care  Patient       Patient will benefit from skilled therapeutic intervention in order to improve the following deficits and impairments:  Pain, Postural dysfunction, Decreased strength, Decreased range of motion, Impaired UE functional use  Visit Diagnosis: Chest wall pain, chronic  Chronic left shoulder pain  Chronic right shoulder pain  Muscle weakness (generalized)     Problem List Patient Active Problem List   Diagnosis Date Noted  . Leukopenia 12/11/2017  . Vitamin B12 deficiency 12/11/2017  . Ulcerative colitis without complications ([facility]) 12/10/2017  . Smoker 12/10/2017  . Chronic chest wall pain   . Uterine fibroid 11/06/2015  . Genetic testing 02/22/2015  . BRCA2 positive 02/22/2015  . FH:   BRCA2 gene positive   . Family history of breast cancer   . Family history of breast cancer in female   . Family history of pancreatic cancer   . Chronic cholecystitis 02/17/2014     R , PT 11/16/2019, 3:10 PM  [facility] [address] [place], [place], [address] Phone: [phone]   Fax:  [phone]  Name: Whitney Jenkins MRN: [id number] Date of Birth: 10/13/1975  

## 2019-11-27 ENCOUNTER — Other Ambulatory Visit: Payer: Self-pay

## 2019-11-27 ENCOUNTER — Encounter (HOSPITAL_COMMUNITY): Payer: Self-pay

## 2019-11-27 ENCOUNTER — Ambulatory Visit (HOSPITAL_COMMUNITY)
Admission: EM | Admit: 2019-11-27 | Discharge: 2019-11-27 | Disposition: A | Discharge status: Home / Self Care | Payer: BC Managed Care – PPO | Attending: Family Medicine | Admitting: Family Medicine

## 2019-11-27 ENCOUNTER — Ambulatory Visit (INDEPENDENT_AMBULATORY_CARE_PROVIDER_SITE_OTHER): Payer: BC Managed Care – PPO

## 2019-11-27 DIAGNOSIS — M79672 Pain in left foot: Secondary | ICD-10-CM | POA: Diagnosis not present

## 2019-11-27 MED ORDER — HYDROCODONE-ACETAMINOPHEN 5-325 MG PO TABS: 1.0000 | ORAL_TABLET | Freq: Four times a day (QID) | ORAL | 0 refills | Status: DC | PRN

## 2019-11-27 MED ORDER — METHYLPREDNISOLONE 4 MG PO TBPK: ORAL_TABLET | ORAL | 0 refills | Status: DC

## 2019-11-27 NOTE — Discharge Instructions (Addendum)
Take the medrol as directed Take all of day one today Take the pain medication if needed Do not drive on the pain medication Wear boot for walking Walk as little as you can manage See your doctor if not better in a few days

## 2019-11-27 NOTE — ED Triage Notes (Signed)
Placed large and then medium shoe on patient, patient chose medium

## 2019-11-27 NOTE — ED Triage Notes (Signed)
Pt state she has left foot pain x 2 days.

## 2019-11-27 NOTE — ED Provider Notes (Signed)
[place]    CSN: [id number] Arrival date & time: 11/27/19  1354      History   Chief Complaint Chief Complaint  Patient presents with  . Foot Pain    HPI Whitney Jenkins is a 44 y.o. female.   HPI   Patient has pain in her left foot.  Is been going on since yesterday.  She states she is walking on the toes of her foot because of her heel is very painful.  She states that she did not have any accident or injury.  She does not have any overuse, more than usual.  She does work in [employer] is on her feet for 10 hours a day 5 days a week.  She wears good shoes.  She is had problems with her right foot in the past but not her left.  The pain is in her left heel.  Mild pain in the midfoot region as well.  No pain in the metatarsals or toes.  No discoloration.  No soft tissue swelling noted.  Past Medical History:  Diagnosis Date  . Anemia    was on iron pills but states she has been off for a while  . Chronic chest wall pain   . Family history of breast cancer   . Family history of breast cancer in female   . Family history of pancreatic cancer   . FH: BRCA2 gene positive   . GERD (gastroesophageal reflux disease)    takes Omeprazole daily  . History of migraine    last one 2wks ago  . Leukopenia 12/11/2017  . Ulcerative colitis ([place])    Followed by Dr. Collene Mares  . Vitamin B12 deficiency 12/11/2017    Patient Active Problem List   Diagnosis Date Noted  . Leukopenia 12/11/2017  . Vitamin B12 deficiency 12/11/2017  . Ulcerative colitis without complications ([place]) 23/55/7322  . Smoker 12/10/2017  . Chronic chest wall pain   . Uterine fibroid 11/06/2015  . Genetic testing 02/22/2015  . BRCA2 positive 02/22/2015  . FH: BRCA2 gene positive   . Family history of breast cancer   . Family history of breast cancer in female   . Family history of pancreatic cancer   . Chronic cholecystitis 02/17/2014    Past Surgical History:  Procedure Laterality Date  . CHOLECYSTECTOMY  N/A 02/22/2014   Procedure: LAPAROSCOPIC CHOLECYSTECTOMY;  Surgeon: Harl Bowie, MD;  Location: [facility];  Service: General;  Laterality: N/A;  . KNEE ARTHROSCOPY Left   . LAPAROSCOPIC BILATERAL SALPINGO OOPHERECTOMY Bilateral 10/20/2018   Procedure: LAPAROSCOPIC BILATERAL SALPINGO OOPHORECTOMY;  Surgeon: Linda Hedges, DO;  Location: [facility];  Service: Gynecology;  Laterality: Bilateral;    OB History   No obstetric history on file.      Home Medications    Prior to Admission medications   Medication Sig Start Date End Date Taking? Authorizing Provider  acetaminophen (TYLENOL) 500 MG tablet Take 1,000 mg by mouth every 6 (six) hours as needed for mild pain.    [provider]  HYDROcodone-acetaminophen (NORCO/VICODIN) 5-325 MG tablet Take 1-2 tablets by mouth every 6 (six) hours as needed. 11/27/19   Raylene Everts, MD  LINZESS 72 MCG capsule Take 72 mcg by mouth daily. 10/23/19   [provider]  mesalamine (CANASA) 1000 MG suppository Place 1,000 mg rectally every other day.    [provider]  mesalamine (LIALDA) 1.2 g EC tablet Take 2.4 g by mouth daily with breakfast.  [provider]  methylPREDNISolone (MEDROL DOSEPAK) 4 MG TBPK tablet tad 11/27/19   Raylene Everts, MD  norelgestromin-ethinyl estradiol Marilu Favre) 150-35 MCG/24HR transdermal patch Xulane 150 mcg-35 mcg/24 hr transdermal patch  APPLY 1 PATCH EVERY WEEK, NO OFF WEEK FOR MENSES    [provider]  omeprazole (PRILOSEC) 40 MG capsule Take 40 mg by mouth daily. 10/01/19   [provider]    Family History Family History  Problem Relation Age of Onset  . Breast cancer Mother 32       recurrance at 39  . BRCA 1/2 Mother        BRCA2 +  . BRCA 1/2 Sister        BRCA2+  . Breast cancer Maternal Aunt   . Colon cancer Maternal Aunt   . Breast cancer Maternal Uncle   . Lung cancer Paternal Uncle   . Lung cancer Maternal Grandmother         smoker  . Lung cancer Maternal Grandfather        smoker  . Pancreatic cancer Maternal Uncle   . Stomach cancer Maternal Uncle     Social History Social History   Tobacco Use  . Smoking status: Current Every Day Smoker    Packs/day: 0.25    Years: 12.00    Pack years: 3.00    Types: Cigarettes    Last attempt to quit: 11/05/2015    Years since quitting: 4.0  . Smokeless tobacco: Never Used  Substance Use Topics  . Alcohol use: No  . Drug use: No     Allergies   Percocet [oxycodone-acetaminophen]   Review of Systems Review of Systems  Musculoskeletal: Positive for arthralgias and gait problem.     Physical Exam Triage Vital Signs ED Triage Vitals  Enc Vitals Group     BP 11/27/19 1442 113/66     Pulse Rate 11/27/19 1442 63     Resp 11/27/19 1440 18     Temp 11/27/19 1442 98.3 F (36.8 C)     Temp Source 11/27/19 1440 Oral     SpO2 11/27/19 1442 100 %     Weight 11/27/19 1440 145 lb (65.8 kg)     Height --      Head Circumference --      Peak Flow --      Pain Score 11/27/19 1440 10     Pain Loc --      Pain Edu? --      Excl. in [place]? --    No data found.  Updated Vital Signs BP 113/66 (BP Location: Right Arm)   Pulse 63   Temp 98.3 F (36.8 C)   Resp 18   Wt 65.8 kg   LMP 09/26/2018 (Approximate)   SpO2 100%   BMI 22.71 kg/m      Physical Exam Constitutional:      General: She is not in acute distress.    Appearance: She is well-developed and normal weight.     Comments: Lean  HENT:     Head: Normocephalic and atraumatic.     Mouth/Throat:     Comments: Mask in place Eyes:     Conjunctiva/sclera: Conjunctivae normal.     Pupils: Pupils are equal, round, and reactive to light.  Cardiovascular:     Rate and Rhythm: Normal rate.  Pulmonary:     Effort: Pulmonary effort is normal. No respiratory distress.  Musculoskeletal:        General: Normal range of  motion.     Cervical back: Normal range of motion.     Comments: Patient has  exquisite tenderness to squeeze pressure of her heel/os calcis on the left.  No tenderness over the Achilles tendon.  Mild tenderness on the plantar surface.  Skin:    General: Skin is warm and dry.  Neurological:     Mental Status: She is alert.      UC Treatments / Results  Labs (all labs ordered are listed, but only abnormal results are displayed) Labs Reviewed - No data to display  EKG   Radiology DG Os Calcis Left  Result Date: 11/27/2019 CLINICAL DATA:  Left heel pain.  Started yesterday. EXAM: LEFT OS CALCIS - 2+ VIEW COMPARISON:  None. FINDINGS: There is no evidence of fracture or other focal bone lesions. Soft tissues are unremarkable. IMPRESSION: Negative. Electronically Signed   By: Kathreen Devoid   On: 11/27/2019 16:05    Procedures Procedures (including critical care time)  Medications Ordered in UC Medications - No data to display  Initial Impression / Assessment and Plan / UC Course  I have reviewed the triage vital signs and the nursing notes.  Pertinent labs & imaging results that were available during my care of the patient were reviewed by me and considered in my medical decision making (see chart for details).     *Heel pain, unclear etiology.  Patient does have a history of stress fractures and I am concerned that that might be her current diagnosis.  I am going to limit her weightbearing.  She should follow-up with her podiatrist. Final Clinical Impressions(s) / UC Diagnoses   Final diagnoses:  Pain of left heel     Discharge Instructions     Take the medrol as directed Take all of day one today Take the pain medication if needed Do not drive on the pain medication Wear boot for walking Walk as little as you can manage See your doctor if not better in a few days    ED Prescriptions    Medication Sig Dispense Auth. Provider   methylPREDNISolone (MEDROL DOSEPAK) 4 MG TBPK tablet tad 21 tablet Raylene Everts, MD    HYDROcodone-acetaminophen (NORCO/VICODIN) 5-325 MG tablet Take 1-2 tablets by mouth every 6 (six) hours as needed. 12 tablet Raylene Everts, MD     I have reviewed the PDMP during this encounter.   Raylene Everts, MD 11/27/19 [phone]

## 2019-12-01 DIAGNOSIS — M722 Plantar fascial fibromatosis: Secondary | ICD-10-CM | POA: Diagnosis not present

## 2019-12-01 DIAGNOSIS — M71572 Other bursitis, not elsewhere classified, left ankle and foot: Secondary | ICD-10-CM | POA: Diagnosis not present

## 2019-12-02 ENCOUNTER — Other Ambulatory Visit: Payer: Self-pay

## 2019-12-02 ENCOUNTER — Encounter: Payer: Self-pay | Admitting: Physical Therapy

## 2019-12-02 ENCOUNTER — Ambulatory Visit: Payer: BC Managed Care – PPO | Attending: Family Medicine | Admitting: Physical Therapy

## 2019-12-02 DIAGNOSIS — M25512 Pain in left shoulder: Secondary | ICD-10-CM | POA: Insufficient documentation

## 2019-12-02 DIAGNOSIS — M25511 Pain in right shoulder: Secondary | ICD-10-CM | POA: Insufficient documentation

## 2019-12-02 DIAGNOSIS — G8929 Other chronic pain: Secondary | ICD-10-CM

## 2019-12-02 DIAGNOSIS — M6281 Muscle weakness (generalized): Secondary | ICD-10-CM

## 2019-12-02 DIAGNOSIS — R0789 Other chest pain: Secondary | ICD-10-CM | POA: Insufficient documentation

## 2019-12-02 NOTE — Therapy (Signed)
[place], [place], [address] Phone: [phone]   Fax:  [phone]  Physical Therapy Treatment  Patient Details  Name: Whitney Jenkins MRN: [id number] Date of Birth: [birth year] Referring Provider (PT): [name], MD   Encounter Date: 12/02/2019  PT End of Session - 12/02/19 1156    Visit Number  2    Number of Visits  6    Date for PT Re-Evaluation  01/13/20    PT Start Time  5400    PT Stop Time  1225    PT Time Calculation (min)  40 min       Past Medical History:  Diagnosis Date  . Anemia    was on iron pills but states she has been off for a while  . Chronic chest wall pain   . Family history of breast cancer   . Family history of breast cancer in female   . Family history of pancreatic cancer   . FH: BRCA2 gene positive   . GERD (gastroesophageal reflux disease)    takes Omeprazole daily  . History of migraine    last one 2wks ago  . Leukopenia 12/11/2017  . Ulcerative colitis ([place])    Followed by Dr. Collene Mares  . Vitamin B12 deficiency 12/11/2017    Past Surgical History:  Procedure Laterality Date  . CHOLECYSTECTOMY N/A 02/22/2014   Procedure: LAPAROSCOPIC CHOLECYSTECTOMY;  Surgeon: Harl Bowie, MD;  Location: [facility];  Service: General;  Laterality: N/A;  . KNEE ARTHROSCOPY Left   . LAPAROSCOPIC BILATERAL SALPINGO OOPHERECTOMY Bilateral 10/20/2018   Procedure: LAPAROSCOPIC BILATERAL SALPINGO OOPHORECTOMY;  Surgeon: Linda Hedges, DO;  Location: [facility];  Service: Gynecology;  Laterality: Bilateral;    There were no vitals filed for this visit.  Subjective Assessment - 12/02/19 1149    Subjective  Last Friday stood up and could not walk on left foot. Went to MD yesterday and was found to have torn ligament in foot. Now I have a boot. My chest is not hurting now because I am taking pain meds.    Currently in Pain?  No/denies   taking pain meds for foot.                       [facility] Adult PT Treatment/Exercise - 12/02/19 0001      Shoulder Exercises: Seated   Other Seated Exercises  retraction x 10       Shoulder Exercises: Standing   External Rotation  Right;Left;10 reps    Theraband Level (Shoulder External Rotation)  Level 2 (Red)    Internal Rotation  Right;Left;10 reps    Theraband Level (Shoulder Internal Rotation)  Level 2 (Red)    Extension  20 reps    Theraband Level (Shoulder Extension)  Level 2 (Red)    Row  20 reps    Theraband Level (Shoulder Row)  Level 2 (Red)      Shoulder Exercises: Stretch   Corner Stretch Limitations  doorway low and mid     Other Shoulder Stretches  supine pec stretch with towel roll along spine       Neck Exercises: Stretches   Upper Trapezius Stretch  3 reps    Levator Stretch  3 reps                  PT Long Term Goals - 11/16/19 1452      PT LONG TERM GOAL #1  Title  Pt will be independent in her HEP and progression.    Baseline  initial progam issued on 11/16/2019    Time  8    Period  Weeks    Status  New    Target Date  01/13/20      PT LONG TERM GOAL #2   Title  Pt will improve her L UE shoulder flexion to >/= 160 degrees with pain <= 4/10.    Time  8    Period  Weeks    Status  New    Target Date  01/13/20      PT LONG TERM GOAL #3   Title  Pt will be able to report pain </= 4/10 following a full shift at work.    Baseline  9/10 to 10/10.    Time  8    Period  Weeks    Status  New    Target Date  01/13/20      PT LONG TERM GOAL #4   Title  Pt will improve L shoulder abduction and extension by >/= 15 degrees  with pain </= 3/10.    Baseline  see flowsheets    Time  8    Period  Weeks    Status  New    Target Date  01/13/20      PT LONG TERM GOAL #5   Title  Pt will be able to lift a 20# object from floor to over head shelf with pain </= 3/10.    Time  8    Period  Weeks    Status  New    Target Date  01/13/20            Plan -  12/02/19 1153    Clinical Impression Statement  Pt arrives reporting improvement with HEP. She does note that her pain levels are not as high after work. Still has difficulty with doorway stretch, increased pulling in chest. Reviewed HEP and progressed with scapular/RTC strengthening. Session tolerated well. She is taking pain meds for her foot and did not have any pain with therex.    PT Next Visit Plan  stretches, UE strengthening, modalities, STM, assess tolerance to strengthening    PT Home Exercise Plan  [id number] (upper trap , levator stretch, supine pec stretch, doorway stretch high and mid,) added red band rows       Patient will benefit from skilled therapeutic intervention in order to improve the following deficits and impairments:  Pain, Postural dysfunction, Decreased strength, Decreased range of motion, Impaired UE functional use  Visit Diagnosis: Chest wall pain, chronic  Chronic left shoulder pain  Chronic right shoulder pain  Muscle weakness (generalized)     Problem List Patient Active Problem List   Diagnosis Date Noted  . Leukopenia 12/11/2017  . Vitamin B12 deficiency 12/11/2017  . Ulcerative colitis without complications ([place]) 93/81/0175  . Smoker 12/10/2017  . Chronic chest wall pain   . Uterine fibroid 11/06/2015  . Genetic testing 02/22/2015  . BRCA2 positive 02/22/2015  . FH: BRCA2 gene positive   . Family history of breast cancer   . Family history of breast cancer in female   . Family history of pancreatic cancer   . Chronic cholecystitis 02/17/2014    Dorene Ar, PTA 12/02/2019, 12:32 PM  [name] [address] [place], [place], [address] Phone: [phone]   Fax:  [phone]  Name: Whitney Jenkins MRN: [id number] Date of Birth: 1976/04/07

## 2019-12-05 DIAGNOSIS — M71572 Other bursitis, not elsewhere classified, left ankle and foot: Secondary | ICD-10-CM | POA: Diagnosis not present

## 2019-12-05 DIAGNOSIS — M722 Plantar fascial fibromatosis: Secondary | ICD-10-CM | POA: Diagnosis not present

## 2019-12-05 DIAGNOSIS — M7732 Calcaneal spur, left foot: Secondary | ICD-10-CM | POA: Diagnosis not present

## 2019-12-07 ENCOUNTER — Encounter: Payer: Self-pay | Admitting: Physical Therapy

## 2019-12-07 ENCOUNTER — Other Ambulatory Visit: Payer: Self-pay

## 2019-12-07 ENCOUNTER — Ambulatory Visit: Payer: BC Managed Care – PPO | Admitting: Physical Therapy

## 2019-12-07 DIAGNOSIS — M6281 Muscle weakness (generalized): Secondary | ICD-10-CM

## 2019-12-07 DIAGNOSIS — M25511 Pain in right shoulder: Secondary | ICD-10-CM | POA: Diagnosis not present

## 2019-12-07 DIAGNOSIS — R0789 Other chest pain: Secondary | ICD-10-CM

## 2019-12-07 DIAGNOSIS — G8929 Other chronic pain: Secondary | ICD-10-CM | POA: Diagnosis not present

## 2019-12-07 DIAGNOSIS — M25512 Pain in left shoulder: Secondary | ICD-10-CM

## 2019-12-07 NOTE — Therapy (Signed)
[place], [place], [address] Phone: [phone]   Fax:  [phone]  Physical Therapy Treatment  Patient Details  Name: Whitney Jenkins MRN: [id number] Date of Birth: [birth year] Referring Provider (PT): [name], MD   Encounter Date: 12/07/2019  PT End of Session - 12/07/19 1439    Visit Number  3    Number of Visits  6    Date for PT Re-Evaluation  01/13/20    Authorization Type  [employer]BS    PT Start Time  9563    PT Stop Time  1517    PT Time Calculation (min)  40 min    Activity Tolerance  Patient tolerated treatment well    Behavior During Therapy  [facility] for tasks assessed/performed       Past Medical History:  Diagnosis Date  . Anemia    was on iron pills but states she has been off for a while  . Chronic chest wall pain   . Family history of breast cancer   . Family history of breast cancer in female   . Family history of pancreatic cancer   . FH: BRCA2 gene positive   . GERD (gastroesophageal reflux disease)    takes Omeprazole daily  . History of migraine    last one 2wks ago  . Leukopenia 12/11/2017  . Ulcerative colitis ([place])    Followed by Dr. Collene Mares  . Vitamin B12 deficiency 12/11/2017    Past Surgical History:  Procedure Laterality Date  . CHOLECYSTECTOMY N/A 02/22/2014   Procedure: LAPAROSCOPIC CHOLECYSTECTOMY;  Surgeon: Harl Bowie, MD;  Location: ;  Service: General;  Laterality: N/A;  . KNEE ARTHROSCOPY Left   . LAPAROSCOPIC BILATERAL SALPINGO OOPHERECTOMY Bilateral 10/20/2018   Procedure: LAPAROSCOPIC BILATERAL SALPINGO OOPHORECTOMY;  Surgeon: Linda Hedges, DO;  Location: [facility];  Service: Gynecology;  Laterality: Bilateral;    There were no vitals filed for this visit.  Subjective Assessment - 12/07/19 1443    Subjective  Patient reports she stopped taking the pain medication about 2 days ago and she has noticed some left shoulder pain/tightness  but denies any chest pain.    Currently in Pain?  Yes    Pain Score  5     Pain Location  Shoulder    Pain Orientation  Left    Pain Descriptors / Indicators  Tightness    Pain Type  Chronic pain    Pain Onset  More than a month ago    Pain Frequency  Intermittent    Aggravating Factors   Cleaning (sweeping)                       [facility] Adult PT Treatment/Exercise - 12/07/19 0001      Exercises   Exercises  Shoulder      Shoulder Exercises: Supine   Horizontal ABduction  15 reps   2 sets   Theraband Level (Shoulder Horizontal ABduction)  Level 2 (Red)      Shoulder Exercises: Seated   Flexion  15 reps   2 sets   Flexion Weight (lbs)  2    Flexion Limitations  scaption to 90 deg    Abduction  15 reps   2 sets   ABduction Weight (lbs)  2    ABduction Limitations  palm down to 90 deg      Shoulder Exercises: Standing   External Rotation  15 reps   2 sets  Theraband Level (Shoulder External Rotation)  Level 2 (Red)    Internal Rotation  15 reps   2 sets   Theraband Level (Shoulder Internal Rotation)  Level 2 (Red)    Extension  20 reps   2 sets   Theraband Level (Shoulder Extension)  Level 2 (Red)    Row  20 reps   2 sets   Theraband Level (Shoulder Row)  Level 2 (Red)    Other Standing Exercises  Plantigrade push-up using table 2x10      Shoulder Exercises: ROM/Strengthening   UBE (Upper Arm Bike)  L1 x 4 min (2 fwd/bwd)      Shoulder Exercises: Stretch   Corner Stretch Limitations  Doorway at 60 and 90 deg abd             PT Education - 12/07/19 1439    Education Details  HEP    Person(s) Educated  Patient    Methods  Explanation;Demonstration;Verbal cues    Comprehension  Verbalized understanding;Returned demonstration;Verbal cues required;Need further instruction          PT Long Term Goals - 11/16/19 1452      PT LONG TERM GOAL #1   Title  Pt will be independent in her HEP and progression.    Baseline  initial progam issued  on 11/16/2019    Time  8    Period  Weeks    Status  New    Target Date  01/13/20      PT LONG TERM GOAL #2   Title  Pt will improve her L UE shoulder flexion to >/= 160 degrees with pain <= 4/10.    Time  8    Period  Weeks    Status  New    Target Date  01/13/20      PT LONG TERM GOAL #3   Title  Pt will be able to report pain </= 4/10 following a full shift at work.    Baseline  9/10 to 10/10.    Time  8    Period  Weeks    Status  New    Target Date  01/13/20      PT LONG TERM GOAL #4   Title  Pt will improve L shoulder abduction and extension by >/= 15 degrees  with pain </= 3/10.    Baseline  see flowsheets    Time  8    Period  Weeks    Status  New    Target Date  01/13/20      PT LONG TERM GOAL #5   Title  Pt will be able to lift a 20# object from floor to over head shelf with pain </= 3/10.    Time  8    Period  Weeks    Status  New    Target Date  01/13/20            Plan - 12/07/19 1440    Clinical Impression Statement  Patient tolerated therapy well without report of pain. She did require cueing to avoid shoulder shrug with exercises. Her HEP was progressed to incorporate more strengthening exercises because her increased pain is most likely result of weakness and tolerance deficit. She will start back to work on Monday so will see how she is responding next visit. She would benefit from continued skilled PT to progress strength and tolerance for working tasks to reduce pain and limitation.    PT Treatment/Interventions  ADLs/Self Care Home Management;Cryotherapy;Ultrasound;Traction;Moist  Heat;Iontophoresis 56m/ml Dexamethasone;Electrical Stimulation;Therapeutic activities;Therapeutic exercise;Neuromuscular re-education;Functional mobility training;Stair training;Gait training;Manual techniques;Patient/family education;Taping;Passive range of motion;Dry needling    PT Next Visit Plan  stretches, UE strengthening, modalities, STM, assess tolerance to  strengthening    PT Home Exercise Plan  [id number]    Consulted and Agree with Plan of Care  Patient       Patient will benefit from skilled therapeutic intervention in order to improve the following deficits and impairments:  Pain, Postural dysfunction, Decreased strength, Decreased range of motion, Impaired UE functional use  Visit Diagnosis: Chest wall pain, chronic  Chronic left shoulder pain  Chronic right shoulder pain  Muscle weakness (generalized)     Problem List Patient Active Problem List   Diagnosis Date Noted  . Leukopenia 12/11/2017  . Vitamin B12 deficiency 12/11/2017  . Ulcerative colitis without complications ([place] 074/60/0298 . Smoker 12/10/2017  . Chronic chest wall pain   . Uterine fibroid 11/06/2015  . Genetic testing 02/22/2015  . BRCA2 positive 02/22/2015  . FH: BRCA2 gene positive   . Family history of breast cancer   . Family history of breast cancer in female   . Family history of pancreatic cancer   . Chronic cholecystitis 02/17/2014    CHilda Blades PT, DPT, LAT, ATC 12/07/19  3:26 PM Phone: [phone]: 3[facility] [address]Phone: [phone]  Fax:  [phone] Name: Whitney GruenbergMRN: [id number] of Birth: 112/05/1976

## 2019-12-07 NOTE — Patient Instructions (Signed)
Access Code: [email or web address]  URL: [email or web address]  Date: 12/07/2019  Prepared by: Hilda Blades   Exercises Seated Gentle Upper Trapezius Stretch - 3 reps - 10-20 seconds hold - 3x daily - 7x weekly Gentle Levator Scapulae Stretch - 3 reps - 10-20 seconds hold - 3x daily - 7x weekly Open Book Chest Stretch on Towel Roll - 60 seconds hold - 3x daily - 7x weekly Doorway Pec Stretch at 60 Elevation - 3 reps - 10-20 seconds hold - 3x daily - 7x weekly Standing Row with Anchored Resistance - 15 reps - 2 sets - 1x daily - 7x weekly Shoulder Extension with Band - 15 reps - 2 sets - 1x daily - 7x weekly Shoulder External Rotation with Anchored Resistance - 15 reps - 2 sets - 1x daily - 7x weekly Shoulder Internal Rotation with Resistance - 15 reps - 2 sets - 1x daily - 7x weekly Supine Shoulder Horizontal Abduction with Resistance - 15 reps - 2 sets - 1x daily - 7x weekly

## 2019-12-12 DIAGNOSIS — M71572 Other bursitis, not elsewhere classified, left ankle and foot: Secondary | ICD-10-CM | POA: Diagnosis not present

## 2019-12-12 DIAGNOSIS — M722 Plantar fascial fibromatosis: Secondary | ICD-10-CM | POA: Diagnosis not present

## 2019-12-14 ENCOUNTER — Ambulatory Visit: Payer: BC Managed Care – PPO | Admitting: Physical Therapy

## 2019-12-14 ENCOUNTER — Encounter: Payer: Self-pay | Admitting: Physical Therapy

## 2019-12-14 ENCOUNTER — Other Ambulatory Visit: Payer: Self-pay

## 2019-12-14 DIAGNOSIS — M6281 Muscle weakness (generalized): Secondary | ICD-10-CM | POA: Diagnosis not present

## 2019-12-14 DIAGNOSIS — G8929 Other chronic pain: Secondary | ICD-10-CM

## 2019-12-14 DIAGNOSIS — R0789 Other chest pain: Secondary | ICD-10-CM | POA: Diagnosis not present

## 2019-12-14 DIAGNOSIS — M25511 Pain in right shoulder: Secondary | ICD-10-CM | POA: Diagnosis not present

## 2019-12-14 DIAGNOSIS — M25512 Pain in left shoulder: Secondary | ICD-10-CM | POA: Diagnosis not present

## 2019-12-14 NOTE — Therapy (Signed)
[place], [place], [address] Phone: [phone]   Fax:  [phone]  Physical Therapy Treatment  Patient Details  Name: Whitney Jenkins MRN: [id number] Date of Birth: Mar 25, 1976 Referring Provider (PT): [name], MD   Encounter Date: 12/14/2019  PT End of Session - 12/14/19 1404    Visit Number  4    Number of Visits  6    Date for PT Re-Evaluation  01/13/20    Authorization Type  BCBS    PT Start Time  9163    PT Stop Time  1437    PT Time Calculation (min)  39 min    Activity Tolerance  Patient tolerated treatment well    Behavior During Therapy  [facility] for tasks assessed/performed       Past Medical History:  Diagnosis Date  . Anemia    was on iron pills but states she has been off for a while  . Chronic chest wall pain   . Family history of breast cancer   . Family history of breast cancer in female   . Family history of pancreatic cancer   . FH: BRCA2 gene positive   . GERD (gastroesophageal reflux disease)    takes Omeprazole daily  . History of migraine    last one 2wks ago  . Leukopenia 12/11/2017  . Ulcerative colitis ([place])    Followed by Dr. Collene Mares  . Vitamin B12 deficiency 12/11/2017    Past Surgical History:  Procedure Laterality Date  . CHOLECYSTECTOMY N/A 02/22/2014   Procedure: LAPAROSCOPIC CHOLECYSTECTOMY;  Surgeon: Harl Bowie, MD;  Location: [place];  Service: General;  Laterality: N/A;  . KNEE ARTHROSCOPY Left   . LAPAROSCOPIC BILATERAL SALPINGO OOPHERECTOMY Bilateral 10/20/2018   Procedure: LAPAROSCOPIC BILATERAL SALPINGO OOPHORECTOMY;  Surgeon: Linda Hedges, DO;  Location: [place];  Service: Gynecology;  Laterality: Bilateral;    There were no vitals filed for this visit.  Subjective Assessment - 12/14/19 1359    Subjective  Patient reports she started back at work and it is tolerable.    Currently in Pain?  No/denies                        [facility] Adult PT Treatment/Exercise - 12/14/19 0001      Exercises   Exercises  Shoulder      Shoulder Exercises: Supine   Horizontal ABduction  15 reps   2 sets   Theraband Level (Shoulder Horizontal ABduction)  Level 2 (Red)      Shoulder Exercises: Standing   External Rotation  15 reps   2 sets   Theraband Level (Shoulder External Rotation)  Level 3 (Green)    Internal Rotation  15 reps   2 sets   Theraband Level (Shoulder Internal Rotation)  Level 3 (Green)    Flexion  10 reps   2 sets   Shoulder Flexion Weight (lbs)  3    ABduction  10 reps   2 sets   Shoulder ABduction Weight (lbs)  3    Extension  20 reps   2 sets   Theraband Level (Shoulder Extension)  Level 3 (Green)    Row  20 reps   2 sets   Theraband Level (Shoulder Row)  Level 3 (Green)    Other Standing Exercises  Plantigrade push-up using table 2x10      Shoulder Exercises: ROM/Strengthening   UBE (Upper Arm Bike)  L1 x 4  min (2 fwd/bwd)      Shoulder Exercises: Stretch   Corner Stretch Limitations  Doorway at 60 and 90 deg abd 15 sec x 3 each    Other Shoulder Stretches  Side lying thoracic rotation stretch 5 sec hold x10 each             PT Education - 12/14/19 1403    Education Details  HEP    Person(s) Educated  Patient    Methods  Explanation;Demonstration;Verbal cues;Handout    Comprehension  Verbalized understanding;Returned demonstration;Verbal cues required;Need further instruction          PT Long Term Goals - 11/16/19 1452      PT LONG TERM GOAL #1   Title  Pt will be independent in her HEP and progression.    Baseline  initial progam issued on 11/16/2019    Time  8    Period  Weeks    Status  New    Target Date  01/13/20      PT LONG TERM GOAL #2   Title  Pt will improve her L UE shoulder flexion to >/= 160 degrees with pain <= 4/10.    Time  8    Period  Weeks    Status  New    Target Date  01/13/20      PT LONG TERM GOAL #3    Title  Pt will be able to report pain </= 4/10 following a full shift at work.    Baseline  9/10 to 10/10.    Time  8    Period  Weeks    Status  New    Target Date  01/13/20      PT LONG TERM GOAL #4   Title  Pt will improve L shoulder abduction and extension by >/= 15 degrees  with pain </= 3/10.    Baseline  see flowsheets    Time  8    Period  Weeks    Status  New    Target Date  01/13/20      PT LONG TERM GOAL #5   Title  Pt will be able to lift a 20# object from floor to over head shelf with pain </= 3/10.    Time  8    Period  Weeks    Status  New    Target Date  01/13/20            Plan - 12/14/19 1408    Clinical Impression Statement  Patient is progressing well with her strengthening without any report of increased pain level. She is tolerating return to work well. She required occasional cueing for technique with exercises and given additional stretches for home with good tolerance. She would benefit from continued skilled PT to progress strength and tolerance for working tasks to reduce pain and limitation.    PT Treatment/Interventions  ADLs/Self Care Home Management;Cryotherapy;Ultrasound;Traction;Moist Heat;Iontophoresis 55m/ml Dexamethasone;Electrical Stimulation;Therapeutic activities;Therapeutic exercise;Neuromuscular re-education;Functional mobility training;Stair training;Gait training;Manual techniques;Patient/family education;Taping;Passive range of motion;Dry needling    PT Next Visit Plan  stretches, UE strengthening, modalities, STM, assess tolerance to strengthening    PT Home Exercise Plan  [id number]: Row, extension, ER, IR, and supine horiz abd with green; upper trap and levator stretch; doorway chest stretch at 60 and 90; side lying thoracic rotation    Consulted and Agree with Plan of Care  Patient       Patient will benefit from skilled therapeutic intervention in order to improve the following deficits  and impairments:  Pain, Postural dysfunction,  Decreased strength, Decreased range of motion, Impaired UE functional use  Visit Diagnosis: Chest wall pain, chronic  Chronic left shoulder pain  Chronic right shoulder pain  Muscle weakness (generalized)     Problem List Patient Active Problem List   Diagnosis Date Noted  . Leukopenia 12/11/2017  . Vitamin B12 deficiency 12/11/2017  . Ulcerative colitis without complications ([place]) 88/41/6606  . Smoker 12/10/2017  . Chronic chest wall pain   . Uterine fibroid 11/06/2015  . Genetic testing 02/22/2015  . BRCA2 positive 02/22/2015  . FH: BRCA2 gene positive   . Family history of breast cancer   . Family history of breast cancer in female   . Family history of pancreatic cancer   . Chronic cholecystitis 02/17/2014    Hilda Blades, PT, DPT, LAT, ATC 12/14/19  2:37 PM Phone: [phone] Fax: [facility] [address] [place], [place], [address] Phone: [phone]   Fax:  [phone]  Name: Whitney Jenkins MRN: [id number] Date of Birth: Feb 05, 1976

## 2019-12-14 NOTE — Patient Instructions (Signed)
Access Code: [email or web address] URL: [email or web address] Date: 12/14/2019 Prepared by: Hilda Blades  Exercises Seated Gentle Upper Trapezius Stretch - 3 x daily - 7 x weekly - 3 reps - 10-20 seconds hold Gentle Levator Scapulae Stretch - 3 x daily - 7 x weekly - 3 reps - 10-20 seconds hold Open Book Chest Stretch on Towel Roll - 3 x daily - 7 x weekly - 60 seconds hold Doorway Pec Stretch at 60 Elevation - 3 x daily - 7 x weekly - 3 reps - 10-20 seconds hold Standing Row with Anchored Resistance - 1 x daily - 7 x weekly - 15 reps - 2 sets Shoulder Extension with Band - 1 x daily - 7 x weekly - 15 reps - 2 sets Shoulder External Rotation with Anchored Resistance - 1 x daily - 7 x weekly - 15 reps - 2 sets Shoulder Internal Rotation with Resistance - 1 x daily - 7 x weekly - 15 reps - 2 sets Supine Shoulder Horizontal Abduction with Resistance - 1 x daily - 7 x weekly - 15 reps - 2 sets Sidelying Thoracic Rotation - 1-3 x daily - 7 x weekly - 10 reps - 5 seconds hold

## 2019-12-20 DIAGNOSIS — M71572 Other bursitis, not elsewhere classified, left ankle and foot: Secondary | ICD-10-CM | POA: Diagnosis not present

## 2019-12-20 DIAGNOSIS — M722 Plantar fascial fibromatosis: Secondary | ICD-10-CM | POA: Diagnosis not present

## 2019-12-20 DIAGNOSIS — M7732 Calcaneal spur, left foot: Secondary | ICD-10-CM | POA: Diagnosis not present

## 2019-12-21 ENCOUNTER — Ambulatory Visit: Payer: BC Managed Care – PPO | Admitting: Physical Therapy

## 2019-12-28 ENCOUNTER — Encounter: Payer: Self-pay | Admitting: Physical Therapy

## 2019-12-28 ENCOUNTER — Other Ambulatory Visit: Payer: Self-pay

## 2019-12-28 ENCOUNTER — Ambulatory Visit: Payer: BC Managed Care – PPO | Admitting: Physical Therapy

## 2019-12-28 DIAGNOSIS — M25511 Pain in right shoulder: Secondary | ICD-10-CM | POA: Diagnosis not present

## 2019-12-28 DIAGNOSIS — G8929 Other chronic pain: Secondary | ICD-10-CM

## 2019-12-28 DIAGNOSIS — R0789 Other chest pain: Secondary | ICD-10-CM

## 2019-12-28 DIAGNOSIS — M6281 Muscle weakness (generalized): Secondary | ICD-10-CM

## 2019-12-28 DIAGNOSIS — M25512 Pain in left shoulder: Secondary | ICD-10-CM | POA: Diagnosis not present

## 2019-12-28 NOTE — Therapy (Addendum)
[place], [place], [address] Phone: [phone]   Fax:  [phone]  Physical Therapy Treatment / Discharge  Patient Details  Name: Whitney Jenkins MRN: [id number] Date of Birth: March 27, 1976 Referring Provider (PT): [name], MD   Encounter Date: 12/28/2019  PT End of Session - 12/28/19 1358    Visit Number  5    Number of Visits  6    Date for PT Re-Evaluation  01/13/20    Authorization Type  BCBS    PT Start Time  1400    PT Stop Time  1440    PT Time Calculation (min)  40 min    Activity Tolerance  Patient tolerated treatment well    Behavior During Therapy  [facility] for tasks assessed/performed       Past Medical History:  Diagnosis Date  . Anemia    was on iron pills but states she has been off for a while  . Chronic chest wall pain   . Family history of breast cancer   . Family history of breast cancer in female   . Family history of pancreatic cancer   . FH: BRCA2 gene positive   . GERD (gastroesophageal reflux disease)    takes Omeprazole daily  . History of migraine    last one 2wks ago  . Leukopenia 12/11/2017  . Ulcerative colitis ([place])    Followed by Dr. Collene Mares  . Vitamin B12 deficiency 12/11/2017    Past Surgical History:  Procedure Laterality Date  . CHOLECYSTECTOMY N/A 02/22/2014   Procedure: LAPAROSCOPIC CHOLECYSTECTOMY;  Surgeon: Harl Bowie, MD;  Location: [place];  Service: General;  Laterality: N/A;  . KNEE ARTHROSCOPY Left   . LAPAROSCOPIC BILATERAL SALPINGO OOPHERECTOMY Bilateral 10/20/2018   Procedure: LAPAROSCOPIC BILATERAL SALPINGO OOPHORECTOMY;  Surgeon: Linda Hedges, DO;  Location: [place];  Service: Gynecology;  Laterality: Bilateral;    There were no vitals filed for this visit.  Subjective Assessment - 12/28/19 1402    Subjective  Patient reports she is doingwell, she has been working and has been busy. She is still having pain that is right  in the middle of her chest, directly over the bone.    Currently in Pain?  Yes    Pain Score  7     Pain Location  Sternum    Pain Descriptors / Indicators  Sore    Pain Type  Chronic pain    Pain Onset  More than a month ago    Pain Frequency  Constant    Aggravating Factors   Unable to tell what makes it worse    Pain Relieving Factors  Heating pad    Effect of Pain on Daily Activities  Patient limited at work         [facility] PT Assessment - 12/28/19 0001      AROM   Overall AROM Comments  Patient exhibits shoulder AROM equal bilaterally and WFL, non-painful in all ranges      Strength   Overall Strength Comments  Shoulder strength grossly 4+/5 throughout, patient reports anterior chest/sternum pain with all muscle testing    Strength Assessment Site  Shoulder    Right Shoulder Flexion  --    Right Shoulder ABduction  --      Palpation   Palpation comment  TTP sternal region approx rib 3-5      Special Tests   Other special tests  All shoulder testing negative  [facility] Adult PT Treatment/Exercise - 12/28/19 0001      Exercises   Exercises  Shoulder      Shoulder Exercises: Standing   External Rotation  15 reps   2 sets   Theraband Level (Shoulder External Rotation)  Level 3 (Green)    Internal Rotation  15 reps   2 sets   Theraband Level (Shoulder Internal Rotation)  Level 3 (Green)    Flexion  10 reps   2 sets   Shoulder Flexion Weight (lbs)  4    ABduction  10 reps   2 sets   Shoulder ABduction Weight (lbs)  4    Other Standing Exercises  Push-up from edge of table 2x10      Shoulder Exercises: ROM/Strengthening   UBE (Upper Arm Bike)  L3 x 4 min (2 fwd/bwd)    Lat Pull Limitations  45# 2x10    Pec Fly Limitations  45# 2x10    Cybex Row Limitations  45# 2x10   horizontal handles     Shoulder Exercises: IT sales professional Limitations  Doorway at 60 and 90 deg abd 15 sec x 3 each    Other Shoulder Stretches  Side lying  thoracic rotation 10 sec hold x5 each    Other Shoulder Stretches  Child's pose with swiss ball 10 sec hold x 5             PT Education - 12/28/19 1358    Education Details  HEP    Person(s) Educated  Patient    Methods  Explanation;Demonstration    Comprehension  Verbalized understanding;Returned demonstration          PT Long Term Goals - 11/16/19 1452      PT LONG TERM GOAL #1   Title  Pt will be independent in her HEP and progression.    Baseline  initial progam issued on 11/16/2019    Time  8    Period  Weeks    Status  New    Target Date  01/13/20      PT LONG TERM GOAL #2   Title  Pt will improve her L UE shoulder flexion to >/= 160 degrees with pain <= 4/10.    Time  8    Period  Weeks    Status  New    Target Date  01/13/20      PT LONG TERM GOAL #3   Title  Pt will be able to report pain </= 4/10 following a full shift at work.    Baseline  9/10 to 10/10.    Time  8    Period  Weeks    Status  New    Target Date  01/13/20      PT LONG TERM GOAL #4   Title  Pt will improve L shoulder abduction and extension by >/= 15 degrees  with pain </= 3/10.    Baseline  see flowsheets    Time  8    Period  Weeks    Status  New    Target Date  01/13/20      PT LONG TERM GOAL #5   Title  Pt will be able to lift a 20# object from floor to over head shelf with pain </= 3/10.    Time  8    Period  Weeks    Status  New    Target Date  01/13/20  Plan - 12/28/19 1359    Clinical Impression Statement  Patient reports continued pain with activity and returning back to work, stating she feels like she is back at where she started. The pain she is having is located over the sternum approx rib 3-5 that radiates out just a bit. She exhibits good range of motion and strength but did report increased concordant pain with strength testing of chest and shoulders. It doesnt seem to be costocondritis, but more related to overuse and tension from increased  activity level. Therapy focused on continued strengthening of shoulder/chest area and added chest strengthening for HEP. She would benefit from continued skilled PT to progress her strength and tolerance for activity to reduce pain while at work and with activity.    PT Treatment/Interventions  ADLs/Self Care Home Management;Cryotherapy;Ultrasound;Traction;Moist Heat;Iontophoresis 57m/ml Dexamethasone;Electrical Stimulation;Therapeutic activities;Therapeutic exercise;Neuromuscular re-education;Functional mobility training;Stair training;Gait training;Manual techniques;Patient/family education;Taping;Passive range of motion;Dry needling    PT Next Visit Plan  stretches, UE strengthening, modalities, STM, assess tolerance to strengthening    PT Home Exercise Plan  [id number]: Row, extension, ER, IR, and supine horiz abd with green; upper trap and levator stretch; doorway chest stretch at 60 and 90; side lying thoracic rotation, push-up from table, chest fly with green band    Consulted and Agree with Plan of Care  Patient       Patient will benefit from skilled therapeutic intervention in order to improve the following deficits and impairments:  Pain, Postural dysfunction, Decreased strength, Decreased range of motion, Impaired UE functional use  Visit Diagnosis: Chronic left shoulder pain  Chest wall pain, chronic  Chronic right shoulder pain  Muscle weakness (generalized)     Problem List Patient Active Problem List   Diagnosis Date Noted  . Leukopenia 12/11/2017  . Vitamin B12 deficiency 12/11/2017  . Ulcerative colitis without complications ([place] 001/56/1537 . Smoker 12/10/2017  . Chronic chest wall pain   . Uterine fibroid 11/06/2015  . Genetic testing 02/22/2015  . BRCA2 positive 02/22/2015  . FH: BRCA2 gene positive   . Family history of breast cancer   . Family history of breast cancer in female   . Family history of pancreatic cancer   . Chronic cholecystitis 02/17/2014     CHilda Blades PT, DPT, LAT, ATC 12/28/19  3:14 PM Phone: [phone]: [facility] [address]Phone: [phone]  Fax:  [phone] Name: AKimia FinanMRN: [id number] of Birth: [birth year]   PHYSICAL THERAPY DISCHARGE SUMMARY  Visits from Start of Care: 5  Current functional level related to goals / functional outcomes: See above   Remaining deficits: See above   Education / Equipment: HEP Plan: Patient agrees to discharge.  Patient goals were not met. Patient is being discharged due to not returning since the last visit.  ?????     CHilda Blades PT, DPT, LAT, ATC 03/01/20  8:20 AM Phone: [phone]: [phone]

## 2019-12-28 NOTE — Patient Instructions (Signed)
Access Code: [email or web address] URL: [email or web address] Date: 12/28/2019 Prepared by: Hilda Blades  Exercises Seated Gentle Upper Trapezius Stretch - 3 x daily - 7 x weekly - 3 reps - 10-20 seconds hold Gentle Levator Scapulae Stretch - 3 x daily - 7 x weekly - 3 reps - 10-20 seconds hold Open Book Chest Stretch on Towel Roll - 3 x daily - 7 x weekly - 60 seconds hold Doorway Pec Stretch at 60 Elevation - 3 x daily - 7 x weekly - 3 reps - 10-20 seconds hold Standing Row with Anchored Resistance - 1 x daily - 7 x weekly - 15 reps - 2 sets Shoulder Extension with Band - 1 x daily - 7 x weekly - 15 reps - 2 sets Shoulder External Rotation with Anchored Resistance - 1 x daily - 7 x weekly - 15 reps - 2 sets Shoulder Internal Rotation with Resistance - 1 x daily - 7 x weekly - 15 reps - 2 sets Supine Shoulder Horizontal Abduction with Resistance - 1 x daily - 7 x weekly - 15 reps - 2 sets Sidelying Thoracic Rotation - 1-3 x daily - 7 x weekly - 10 reps - 5 seconds hold Push Up on Table - 1 x daily - 7 x weekly - 2 sets - 10 reps Chest Fly with Resistance - 1 x daily - 7 x weekly - 2 sets - 10 reps

## 2020-01-12 ENCOUNTER — Ambulatory Visit: Payer: BC Managed Care – PPO | Admitting: Physical Therapy

## 2020-01-25 ENCOUNTER — Encounter: Payer: BC Managed Care – PPO | Admitting: Physical Therapy

## 2020-03-14 ENCOUNTER — Encounter: Payer: Self-pay | Admitting: Family Medicine

## 2020-03-21 ENCOUNTER — Encounter: Payer: Self-pay | Admitting: Family Medicine

## 2020-03-21 ENCOUNTER — Ambulatory Visit (INDEPENDENT_AMBULATORY_CARE_PROVIDER_SITE_OTHER): Payer: BC Managed Care – PPO | Admitting: Family Medicine

## 2020-03-21 VITALS — BP 100/64 | HR 57 | Wt 141.2 lb

## 2020-03-21 DIAGNOSIS — R7989 Other specified abnormal findings of blood chemistry: Secondary | ICD-10-CM | POA: Diagnosis not present

## 2020-03-21 DIAGNOSIS — Z8349 Family history of other endocrine, nutritional and metabolic diseases: Secondary | ICD-10-CM | POA: Diagnosis not present

## 2020-03-21 NOTE — Progress Notes (Signed)
   Subjective:    Patient ID: Whitney Jenkins, female    DOB: 1976/08/18, 44 y.o.   MRN: [id number]  HPI Chief Complaint  Patient presents with  . discuss thyroid issues    thyroid was too low   She is here to discuss low TSH at her recent appointment with Dr. Collene Mares, GI. States her sister has Graves' disease.  Denies having any symptoms.  Denies fever, chills, dizziness, chest pain, palpitations, shortness of breath, abdominal pain, N/V/D, urinary symptoms, LE edema.   She sees Dr. Collene Mares for constipation.   Reviewed allergies, medications, past medical, surgical, family, and social history.    Review of Systems Pertinent positives and negatives in the history of present illness.     Objective:   Physical Exam BP 100/64   Pulse (!) 57   Wt 141 lb 3.2 oz (64 kg)   LMP 09/26/2018 (Approximate)   SpO2 98%   BMI 22.12 kg/m   Alert and in no distress. Neck is supple without adenopathy, thyromegaly or palpable nodules.  She has a swollen right anterior cervical lymph node.   cardiac exam shows a regular sinus rhythm without murmurs or gallops. Lungs are clear to auscultation.  Extremities without edema.  Skin is warm and dry.  DTRs symmetric and normal.       Assessment & Plan:  Low TSH level - Plan: TSH, T4, free, T3, Thyroid peroxidase antibody  Family history of thyroid disease in sister - Plan: TSH, T4, free, T3, Thyroid peroxidase antibody  Asymptomatic. Check thyroid panel and follow up.

## 2020-03-22 ENCOUNTER — Other Ambulatory Visit: Payer: Self-pay | Admitting: Internal Medicine

## 2020-03-22 DIAGNOSIS — R7989 Other specified abnormal findings of blood chemistry: Secondary | ICD-10-CM

## 2020-03-22 LAB — TSH: TSH: 0.424 u[IU]/mL — ABNORMAL LOW (ref 0.450–4.500)

## 2020-03-22 LAB — THYROID PEROXIDASE ANTIBODY: Thyroperoxidase Ab SerPl-aCnc: <9 IU/mL (ref 0–34)

## 2020-03-22 LAB — T3: T3, Total: 150 ng/dL (ref 71–180)

## 2020-03-22 LAB — T4, FREE: Free T4: 1.11 ng/dL (ref 0.82–1.77)

## 2020-03-22 NOTE — Progress Notes (Signed)
Recheck TSH, free T4 in 4-5 weeks due to low TSH. This can be a lab visit.

## 2020-03-27 ENCOUNTER — Encounter: Payer: Self-pay | Admitting: Family Medicine

## 2020-06-14 ENCOUNTER — Other Ambulatory Visit: Payer: Self-pay

## 2020-06-14 ENCOUNTER — Ambulatory Visit (HOSPITAL_COMMUNITY)
Admission: EM | Admit: 2020-06-14 | Discharge: 2020-06-14 | Disposition: A | Discharge status: Home / Self Care | Payer: BC Managed Care – PPO

## 2020-06-14 ENCOUNTER — Encounter (HOSPITAL_COMMUNITY): Payer: Self-pay | Admitting: Emergency Medicine

## 2020-06-14 DIAGNOSIS — J029 Acute pharyngitis, unspecified: Secondary | ICD-10-CM

## 2020-06-14 DIAGNOSIS — I889 Nonspecific lymphadenitis, unspecified: Secondary | ICD-10-CM

## 2020-06-14 NOTE — Discharge Instructions (Addendum)
Viral sore throat  Continue with supportive care at home.   Take ibuprofen, tylenol as needed  Follow up if symptoms are persisting

## 2020-06-14 NOTE — ED Provider Notes (Signed)
[place]   [id number] 06/14/20 Arrival Time: 1645  WO:EHOZ THROAT  SUBJECTIVE: History from: patient.  Whitney Jenkins is a 44 y.o. female who presents with abrupt onset of sore throat for the last 4 days. Reports that she has been using theraflu and lozenges. Denies sick exposure to Covid, strep, flu or mono, or precipitating event. Has negative history of Covid. Has not completed Covid vaccines. Symptoms are made worse with swallowing, but tolerating liquids and own secretions without difficulty.  Denies previous symptoms in the past.     Denies fever, chills, fatigue, ear pain, sinus pain, rhinorrhea, nasal congestion, cough, SOB, wheezing, chest pain, nausea, rash, changes in bowel or bladder habits.     ROS: As per HPI.  All other pertinent ROS negative.     Past Medical History:  Diagnosis Date  . Anemia    was on iron pills but states she has been off for a while  . Chronic chest wall pain   . Family history of breast cancer   . Family history of breast cancer in female   . Family history of pancreatic cancer   . FH: BRCA2 gene positive   . GERD (gastroesophageal reflux disease)    takes Omeprazole daily  . History of migraine    last one 2wks ago  . Leukopenia 12/11/2017  . Ulcerative colitis ([place])    Followed by Dr. Collene Mares  . Vitamin B12 deficiency 12/11/2017   Past Surgical History:  Procedure Laterality Date  . CHOLECYSTECTOMY N/A 02/22/2014   Procedure: LAPAROSCOPIC CHOLECYSTECTOMY;  Surgeon: Harl Bowie, MD;  Location: [facility];  Service: General;  Laterality: N/A;  . KNEE ARTHROSCOPY Left   . LAPAROSCOPIC BILATERAL SALPINGO OOPHERECTOMY Bilateral 10/20/2018   Procedure: LAPAROSCOPIC BILATERAL SALPINGO OOPHORECTOMY;  Surgeon: Linda Hedges, DO;  Location: [facility];  Service: Gynecology;  Laterality: Bilateral;   Allergies  Allergen Reactions  . Percocet [Oxycodone-Acetaminophen] Nausea And Vomiting   No current facility-administered  medications on file prior to encounter.   Current Outpatient Medications on File Prior to Encounter  Medication Sig Dispense Refill  . acetaminophen (TYLENOL) 500 MG tablet Take 1,000 mg by mouth every 6 (six) hours as needed for mild pain.    . Cholecalciferol (D3 ADULT PO) Take by mouth.    . Cyanocobalamin (B-12 PO) Take by mouth.    Marland Kitchen LINZESS 72 MCG capsule Take 72 mcg by mouth daily.    Marland Kitchen MAGNESIUM PO Take by mouth.    . mesalamine (CANASA) 1000 MG suppository Place 1,000 mg rectally every other day.    . mesalamine (LIALDA) 1.2 g EC tablet Take 2.4 g by mouth daily with breakfast.     . Multiple Vitamin (MULTIVITAMIN ADULT PO) Take by mouth.    . Multiple Vitamins-Minerals (CENTRUM PO) Take by mouth.    . Norethindrone-Ethinyl Estradiol-Fe Biphas (LO LOESTRIN FE) 1 MG-10 MCG / 10 MCG tablet Lo Loestrin Fe 1 mg-10 mcg (24)/10 mcg (2) tablet    . norgestrel-ethinyl estradiol (LO/OVRAL) 0.3-30 MG-MCG tablet Take 1 tablet by mouth daily.    Marland Kitchen omeprazole (PRILOSEC) 40 MG capsule Take 40 mg by mouth daily.    Marland Kitchen VITAMIN K PO Take by mouth.     Social History   Socioeconomic History  . Marital status: Single    Spouse name: Not on file  . Number of children: Not on file  . Years of education: Not on file  . Highest education level: Not on file  Occupational History  . Not on file  Tobacco Use  . Smoking status: Current Every Day Smoker    Packs/day: 0.25    Years: 12.00    Pack years: 3.00    Types: Cigarettes    Last attempt to quit: 11/05/2015    Years since quitting: 4.6  . Smokeless tobacco: Never Used  Vaping Use  . Vaping Use: Never used  Substance and Sexual Activity  . Alcohol use: No  . Drug use: No  . Sexual activity: Not Currently  Other Topics Concern  . Not on file  Social History Narrative  . Not on file   Social Determinants of Health   Financial Resource Strain:   . Difficulty of Paying Living Expenses: Not on file  Food Insecurity:   . Worried About  Charity fundraiser in the Last Year: Not on file  . Ran Out of Food in the Last Year: Not on file  Transportation Needs:   . Lack of Transportation (Medical): Not on file  . Lack of Transportation (Non-Medical): Not on file  Physical Activity:   . Days of Exercise per Week: Not on file  . Minutes of Exercise per Session: Not on file  Stress:   . Feeling of Stress : Not on file  Social Connections:   . Frequency of Communication with Friends and Family: Not on file  . Frequency of Social Gatherings with Friends and Family: Not on file  . Attends Religious Services: Not on file  . Active Member of Clubs or Organizations: Not on file  . Attends Archivist Meetings: Not on file  . Marital Status: Not on file  Intimate Partner Violence:   . Fear of Current or Ex-Partner: Not on file  . Emotionally Abused: Not on file  . Physically Abused: Not on file  . Sexually Abused: Not on file   Family History  Problem Relation Age of Onset  . Breast cancer Mother 59       recurrance at 71  . BRCA 1/2 Mother        BRCA2 +  . BRCA 1/2 Sister        BRCA2+  . Breast cancer Maternal Aunt   . Colon cancer Maternal Aunt   . Breast cancer Maternal Uncle   . Lung cancer Paternal Uncle   . Lung cancer Maternal Grandmother        smoker  . Lung cancer Maternal Grandfather        smoker  . Pancreatic cancer Maternal Uncle   . Stomach cancer Maternal Uncle     OBJECTIVE:  Vitals:   06/14/20 1816  BP: 136/81  Pulse: 74  Resp: 18  Temp: 98 F (36.7 C)  TempSrc: Oral  SpO2: 100%     General appearance: alert; appears fatigued, but nontoxic, speaking in full sentences and managing own secretions HEENT: NCAT; Ears: EACs clear, TMs pearly gray with visible cone of light, without erythema; Eyes: PERRL, EOMI grossly; Nose: no obvious rhinorrhea; Throat: oropharynx clear, tonsils 1+ and mildly erythematous without white tonsillar exudates, uvula midline Neck: supple with LAD Lungs:  CTA bilaterally without adventitious breath sounds; cough absent Heart: regular rate and rhythm.  Radial pulses 2+ symmetrical bilaterally Skin: warm and dry Psychological: alert and cooperative; normal mood and affect  LABS: No results found for this or any previous visit (from the past 24 hour(s)).   ASSESSMENT & PLAN:  1. Pharyngitis, unspecified etiology   2. Lymphadenitis  Continue supportive care at home  Get plenty of rest and push fluids Take OTC Zyrtec and use chloraseptic spray as needed for throat pain. Drink warm or cool liquids, use throat lozenges, or popsicles to help alleviate symptoms Take OTC ibuprofen or tylenol as needed for pain  Follow up with PCP if symptoms persists Return or go to ER if patient has any new or worsening symptoms such as fever, chills, nausea, vomiting, worsening sore throat, cough, abdominal pain, chest pain, changes in bowel or bladder habits  Reviewed expectations re: course of current medical issues. Questions answered. Outlined signs and symptoms indicating need for more acute intervention. Patient verbalized understanding. After Visit Summary given.          Faustino Congress, NP 06/14/20 1900

## 2020-06-14 NOTE — ED Triage Notes (Addendum)
Pt presents with sore throat and swollen lymph nodes xs 3 days. Denies fever. States was tested for COVID 2 days ago with negative result.

## 2020-06-25 ENCOUNTER — Encounter: Payer: Self-pay | Admitting: Family Medicine

## 2020-06-26 ENCOUNTER — Other Ambulatory Visit: Payer: Self-pay

## 2020-06-26 ENCOUNTER — Ambulatory Visit (INDEPENDENT_AMBULATORY_CARE_PROVIDER_SITE_OTHER): Payer: BC Managed Care – PPO | Admitting: Family Medicine

## 2020-06-26 ENCOUNTER — Encounter: Payer: Self-pay | Admitting: Family Medicine

## 2020-06-26 VITALS — BP 128/78 | HR 68 | Temp 98.0°F | Wt 151.0 lb

## 2020-06-26 DIAGNOSIS — R0789 Other chest pain: Secondary | ICD-10-CM | POA: Diagnosis not present

## 2020-06-26 DIAGNOSIS — K5100 Ulcerative (chronic) pancolitis without complications: Secondary | ICD-10-CM

## 2020-06-26 DIAGNOSIS — R7989 Other specified abnormal findings of blood chemistry: Secondary | ICD-10-CM

## 2020-06-26 DIAGNOSIS — Z8349 Family history of other endocrine, nutritional and metabolic diseases: Secondary | ICD-10-CM | POA: Diagnosis not present

## 2020-06-26 DIAGNOSIS — Z23 Encounter for immunization: Secondary | ICD-10-CM | POA: Diagnosis not present

## 2020-06-26 DIAGNOSIS — G8929 Other chronic pain: Secondary | ICD-10-CM

## 2020-06-26 NOTE — Progress Notes (Signed)
   Subjective:    Patient ID: Whitney Jenkins, female    DOB: [birth year], 44 y.o.   MRN: [id number]  HPI She is here for evaluation of a 1 week history of upper back pressure and a 1 day history of headache.  She states that prior to this she did get a new mattress but is using the same pillow.  She states that she does have some radiation of some discomfort into her left hand and to all of the fingers.  Review of her record indicates that she has had difficulty with this for several months although she did not mention it.  She states that she cannot take an NSAID because of her ulcerative colitis. Review of the record also indicates she needs follow-up on her thyroid.  Review of Systems     Objective:   Physical Exam Alert and complaining of upper back pain.  Tender to palpation over both trapezius and rhomboids as well as lats.  Full motion of the neck.  DTRs are 3+.  Skin is normal.       Assessment & Plan:  Low TSH level - Plan: Thyroid Profile, TSH  Family history of thyroid disease in sister - Plan: Thyroid Profile, TSH  Musculoskeletal chest pain  Need for influenza vaccination - Plan: Flu Vaccine QUAD 6+ mos PF IM (Fluarix Quad PF)  Chronic chest wall pain  Ulcerative pancolitis without complication ([place]) Very difficult to determine exactly what is going on with her.  I will follow-up on the thyroid and defer further evaluation and treatment to [facility].

## 2020-06-26 NOTE — Patient Instructions (Signed)
Heat for 20 minutes 3 times per day and to Tylenol 4 times per day for the aches and pains

## 2020-06-27 LAB — THYROID PANEL
Free Thyroxine Index: 2.3 (ref 1.2–4.9)
T3 Uptake Ratio: 25 % (ref 24–39)
T4, Total: 9.2 ug/dL (ref 4.5–12.0)

## 2020-06-27 LAB — TSH: TSH: 0.93 u[IU]/mL (ref 0.450–4.500)

## 2020-07-04 DIAGNOSIS — G8929 Other chronic pain: Secondary | ICD-10-CM | POA: Insufficient documentation

## 2020-07-04 DIAGNOSIS — Z90722 Acquired absence of ovaries, bilateral: Secondary | ICD-10-CM | POA: Insufficient documentation

## 2020-08-31 ENCOUNTER — Other Ambulatory Visit: Payer: Self-pay

## 2020-08-31 ENCOUNTER — Ambulatory Visit (INDEPENDENT_AMBULATORY_CARE_PROVIDER_SITE_OTHER): Payer: BC Managed Care – PPO | Admitting: Internal Medicine

## 2020-08-31 ENCOUNTER — Encounter: Payer: Self-pay | Admitting: Internal Medicine

## 2020-08-31 ENCOUNTER — Ambulatory Visit: Payer: Self-pay

## 2020-08-31 VITALS — BP 108/68 | HR 60 | Resp 14 | Ht 69.0 in | Wt 151.0 lb

## 2020-08-31 DIAGNOSIS — M25511 Pain in right shoulder: Secondary | ICD-10-CM | POA: Diagnosis not present

## 2020-08-31 DIAGNOSIS — M25512 Pain in left shoulder: Secondary | ICD-10-CM | POA: Diagnosis not present

## 2020-08-31 DIAGNOSIS — K5100 Ulcerative (chronic) pancolitis without complications: Secondary | ICD-10-CM | POA: Diagnosis not present

## 2020-08-31 DIAGNOSIS — M5459 Other low back pain: Secondary | ICD-10-CM

## 2020-08-31 DIAGNOSIS — G8929 Other chronic pain: Secondary | ICD-10-CM

## 2020-08-31 DIAGNOSIS — M545 Low back pain, unspecified: Secondary | ICD-10-CM | POA: Insufficient documentation

## 2020-08-31 DIAGNOSIS — M940 Chondrocostal junction syndrome [Tietze]: Secondary | ICD-10-CM | POA: Diagnosis not present

## 2020-08-31 DIAGNOSIS — Z79899 Other long term (current) drug therapy: Secondary | ICD-10-CM

## 2020-08-31 DIAGNOSIS — M461 Sacroiliitis, not elsewhere classified: Secondary | ICD-10-CM | POA: Insufficient documentation

## 2020-08-31 NOTE — Patient Instructions (Addendum)
Your xrays are consistent with inflammation of the sacroiliac joint    Sulfasalazine tablets What is this medicine? SULFASALAZINE (sul fa SAL a zeen) is used to treat ulcerative colitis. This medicine may be used for other purposes; ask your health care provider or pharmacist if you have questions. COMMON BRAND NAME(S): Azulfidine, Sulfazine What should I tell my health care provider before I take this medicine? They need to know if you have any of these conditions:  asthma  blood disorders or anemia  glucose-6-phosphate dehydrogenase (G6PD) deficiency  intestinal obstruction  kidney disease  liver disease  porphyria  urinary tract obstruction  an unusual reaction to sulfasalazine, sulfa drugs, salicylates, or other medicines, foods, dyes, or preservatives  pregnant or trying to get pregnant  breast-feeding How should I use this medicine? Take this medicine by mouth with a full glass of water. Follow the directions on the prescription label. If the medicine upsets your stomach, take it with food or milk. Take your medicine at regular intervals. Do not take your medicine more often than directed. Do not stop taking except on your doctor's advice. Talk to your pediatrician regarding the use of this medicine in children. While this drug may be prescribed for children as young as 6 years for selected conditions, precautions do apply. Patients over 5 years old may have a stronger reaction and need a smaller dose. Overdosage: If you think you have taken too much of this medicine contact a poison control center or emergency room at once. NOTE: This medicine is only for you. Do not share this medicine with others. What if I miss a dose? If you miss a dose, take it as soon as you can. If it is almost time for your next dose, take only that dose. Do not take double or extra doses. What may interact with this medicine?  digoxin  folic acid This list may not describe all possible  interactions. Give your health care provider a list of all the medicines, herbs, non-prescription drugs, or dietary supplements you use. Also tell them if you smoke, drink alcohol, or use illegal drugs. Some items may interact with your medicine. What should I watch for while using this medicine? Visit your doctor or health care professional for regular checks on your progress. You will need frequent blood and urine checks. This medicine can make you more sensitive to the sun. Keep out of the sun. If you cannot avoid being in the sun, wear protective clothing and use sunscreen. Do not use sun lamps or tanning beds/booths. Drink plenty of water while taking this medicine. What side effects may I notice from receiving this medicine? Side effects that you should report to your doctor or health care professional as soon as possible:  allergic reactions like skin rash, itching or hives, swelling of the face, lips, or tongue  fever, chills, or any other sign of infection  painful, difficult, or reduced urination  redness, blistering, peeling or loosening of the skin, including inside the mouth  severe stomach pain  unusual bleeding or bruising  unusually weak or tired  yellowing of the skin or eyes Side effects that usually do not require medical attention (report to your doctor or health care professional if they continue or are bothersome):  headache  loss of appetite  nausea, vomiting  orange color to the urine  reduced sperm count This list may not describe all possible side effects. Call your doctor for medical advice about side effects. You may report side  effects to FDA at 1-800-FDA-1088. Where should I keep my medicine? Keep out of the reach of children. Store at room temperature between 15 and 30 degrees C (59 and 86 degrees F). Keep container tightly closed. Throw away any unused medicine after the expiration date. NOTE: This sheet is a summary. It may not cover all possible  information. If you have questions about this medicine, talk to your doctor, pharmacist, or health care provider.  2020 Elsevier/Gold Standard (2008-05-17 11:38:15)

## 2020-08-31 NOTE — Progress Notes (Signed)
Office Visit Note  Patient: Whitney Jenkins             Date of Birth: 06-12-1976           MRN: [id number]             PCP: Lilian Coma., MD Referring: Lilian Coma., MD Visit Date: 08/31/2020   Subjective:   History of Present Illness: Whitney Jenkins is a 44 y.o. female with a history of bilateral oophorectomy due to familial BRCA2 gene positivity and ulcerative colitis diagnosed 3 to 4 years ago here for evaluation of recurrent chest wall pain, bilateral shoulder pain, and back pain that is ongoing for about 1 year duration.  This for started with acute onset of pain in the anterior chest for which she sought ED evaluation work-up was felt to represent costochondritis.  She was treated with Tylenol that was not very effective.  She cannot tolerate nonsteroidal anti-inflammatories due to previous exacerbation of UC and GI ulcer.  Symptoms of this wax and wane during this time she also noticed some low back pain and stiffness and also bilateral shoulder pain.  Now the shoulder pain has been more persistent.  She was prescribed a short course of prednisone in October which was significant benefit to her symptoms.  She has not noticed any flareup or disease activity of the ulcerative colitis for which she takes mesalamine that has been a treatment since original diagnosis.  She has eye examination every 6 months with tonometry monitoring for possible glaucoma and frequent retro-orbital headaches.   She sees Dr. Juanita Craver for gastroenterology.  Activities of Daily Living:  Patient reports morning stiffness for a few minutes.   Patient Reports nocturnal pain.  Difficulty dressing/grooming: Reports Difficulty climbing stairs: Denies Difficulty getting out of chair: Denies Difficulty using hands for taps, buttons, cutlery, and/or writing: Denies  Review of Systems  Constitutional: Negative for fatigue.  HENT: Negative for mouth sores, mouth dryness and nose dryness.   Eyes: Negative for  pain, itching and dryness.  Respiratory: Negative for shortness of breath and difficulty breathing.   Cardiovascular: Negative for chest pain and palpitations.  Gastrointestinal: Positive for constipation. Negative for blood in stool and diarrhea.  Endocrine: Negative for increased urination.  Genitourinary: Negative for difficulty urinating.  Musculoskeletal: Positive for arthralgias, joint pain, myalgias, morning stiffness, muscle tenderness and myalgias. Negative for joint swelling.  Skin: Negative for color change, rash and redness.  Allergic/Immunologic: Negative for susceptible to infections.  Neurological: Positive for headaches. Negative for dizziness, numbness, memory loss and weakness.  Hematological: Negative for bruising/bleeding tendency.  Psychiatric/Behavioral: Positive for sleep disturbance. Negative for depressed mood and confusion. The patient is not nervous/anxious.     PMFS History:  Patient Active Problem List   Diagnosis Date Noted  . Costochondritis 08/31/2020  . Bilateral shoulder pain 08/31/2020  . Low back pain 08/31/2020  . High risk medication use 08/31/2020  . Sacroiliitis, not elsewhere classified ([place]) 08/31/2020  . Chronic chest wall pain 07/04/2020  . History of bilateral oophorectomies 07/04/2020  . Low TSH level 03/21/2020  . Family history of thyroid disease in sister 03/21/2020  . Leukopenia 12/11/2017  . Vitamin B12 deficiency 12/11/2017  . Ulcerative colitis, unspecified, without complications ([place]) 03/70/4888  . Uterine fibroid 11/06/2015  . Genetic testing 02/22/2015  . BRCA2 positive 02/22/2015  . FH: BRCA2 gene positive   . Family history of breast cancer   . Family history of breast cancer in female   .  Family history of pancreatic cancer   . Chronic cholecystitis 02/17/2014    Past Medical History:  Diagnosis Date  . Anemia    was on iron pills but states she has been off for a while  . Chronic chest wall pain   . Family history of  breast cancer   . Family history of breast cancer in female   . Family history of pancreatic cancer   . FH: BRCA2 gene positive   . GERD (gastroesophageal reflux disease)    takes Omeprazole daily  . History of migraine    last one 2wks ago  . Leukopenia 12/11/2017  . Ulcerative colitis ([place])    Followed by Dr. Collene Mares  . Vitamin B12 deficiency 12/11/2017    Family History  Problem Relation Age of Onset  . Breast cancer Mother 1       recurrance at 58  . BRCA 1/2 Mother        BRCA2 +  . Congestive Heart Failure Father   . COPD Father   . Lung cancer Father   . BRCA 1/2 Sister        BRCA2+  . Graves' disease Sister   . Breast cancer Maternal Aunt   . Colon cancer Maternal Aunt   . Breast cancer Maternal Uncle   . Lung cancer Paternal Uncle   . Lung cancer Maternal Grandmother        smoker  . Lung cancer Maternal Grandfather        smoker  . Pancreatic cancer Maternal Uncle   . Stomach cancer Maternal Uncle    Past Surgical History:  Procedure Laterality Date  . CHOLECYSTECTOMY N/A 02/22/2014   Procedure: LAPAROSCOPIC CHOLECYSTECTOMY;  Surgeon: Harl Bowie, MD;  Location: [facility];  Service: General;  Laterality: N/A;  . KNEE ARTHROSCOPY Left   . LAPAROSCOPIC BILATERAL SALPINGO OOPHERECTOMY Bilateral 10/20/2018   Procedure: LAPAROSCOPIC BILATERAL SALPINGO OOPHORECTOMY;  Surgeon: Linda Hedges, DO;  Location: [facility];  Service: Gynecology;  Laterality: Bilateral;  . MYOMECTOMY     Social History   Social History Narrative  . Not on file   Immunization History  Administered Date(s) Administered  . Influenza,inj,Quad PF,6+ Mos 10/09/2016, 06/26/2020  . PFIZER SARS-COV-2 Vaccination 12/29/2019, 01/23/2020  . Tdap 11/24/2011     Objective: Vital Signs: BP 108/68 (BP Location: Left Arm, Patient Position: Sitting, Cuff Size: Normal)   Pulse 60   Resp 14   Ht 5' 9"  (1.753 m)   Wt 151 lb (68.5 kg)   LMP 09/26/2018 (Approximate)   BMI 22.30 kg/m     Physical Exam Constitutional:      Appearance: Normal appearance.  HENT:     Head: Normocephalic.     Right Ear: External ear normal.     Left Ear: External ear normal.     Mouth/Throat:     Mouth: Mucous membranes are moist.     Pharynx: Oropharynx is clear.  Eyes:     Conjunctiva/sclera: Conjunctivae normal.  Cardiovascular:     Rate and Rhythm: Normal rate and regular rhythm.  Pulmonary:     Effort: Pulmonary effort is normal.     Breath sounds: Normal breath sounds.  Skin:    General: Skin is warm and dry.     Findings: No rash.  Neurological:     General: No focal deficit present.     Mental Status: She is alert.  Psychiatric:        Mood and Affect: Mood normal.  Musculoskeletal Exam:  Neck full range of motion no tenderness Shoulders are tender to palpation and have pain with active and passive range of motion in all planes much greater on left than on right without appreciable swelling warmth or erythema and normal range of motion preserved Elbow, wrist, fingers full range of motion no tenderness or swelling Paraspinal tenderness to palpation is present over upper and lower back bilaterally Normal hip internal and external rotation without pain, no tenderness to lateral hip palpation Knees full range of motion with patellofemoral crepitus no tenderness or swelling Ankles normal ROM no tenderness or swelling   Investigation: No additional findings.  Imaging: XR Lumbar Spine 2-3 Views  Result Date: 08/31/2020 X-ray lumbar spine 2 views Intervertebral disc spaces are intact.  Possible very early anterior endplate osteophytes starting at levels L3-L5.  Some straightening of the lumbar lordosis.  No syndesmophytes or bridging osteophytes are seen. Impression No significant arthritis abnormality seen  XR Pelvis 1-2 Views  Result Date: 08/31/2020 X-ray pelvis 2 views AP and Ferguson Sacroiliac joints are patent bilaterally but with right greater than left iliac  side sclerosis especially in the upper two thirds portion.  Hip joints do not show significant arthritis changes bilaterally.  Otherwise bone mineralization appears normal. Impression Findings suggest right greater than left asymmetric sacroiliitis without erosion or fusion  XR Shoulder Left  Result Date: 08/31/2020 X-ray left shoulder 4 views Normal appearance of glenohumeral and acromioclavicular joints.  No significant sclerosis or erosion seen.  No visible effusions seen.  No soft tissue calcifications or swelling. Impression Normal-appearing shoulder x-ray  XR Shoulder Right  Result Date: 08/31/2020 X-ray right shoulder 4 views Glenohumeral and acromioclavicular joints appear normal.  No sclerosis or erosions are seen.  No visible joint effusions.  No soft tissues calcifications or swelling seen. Impression Normal appearing shoulder x-ray   Recent Labs: Lab Results  Component Value Date   WBC 5.4 10/28/2019   HGB 12.4 10/28/2019   PLT 202 10/28/2019   NA 139 10/28/2019   K 4.2 10/28/2019   CL 104 10/28/2019   CO2 23 10/28/2019   GLUCOSE 91 10/28/2019   BUN 5 (L) 10/28/2019   CREATININE 0.70 10/28/2019   BILITOT 0.3 10/28/2019   ALKPHOS 39 10/28/2019   AST 17 10/28/2019   ALT 11 10/28/2019   PROT 6.9 10/28/2019   ALBUMIN 4.1 10/28/2019   CALCIUM 8.9 10/28/2019   GFRAA 124 10/28/2019    Speciality Comments: No specialty comments available.  Procedures:  No procedures performed Allergies: Percocet [oxycodone-acetaminophen]   Assessment / Plan:     Visit Diagnoses: Costochondritis - Plan: Sedimentation rate, C-reactive protein  History of costochondritis but currently painful symptoms are limited to the bilateral shoulders and the low back and hip.  Apparently symptoms were responsive to steroid course.  She does not have cutaneous symptoms or other features suggesting auto inflammatory process.   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  Her bowel disease has been well controlled on mesalamine so switching to sulfasalazine for systemic DMARD exposure may be beneficial with minimal treatment plan change.   Ulcerative pancolitis without complication [facility])   Patient sees Dr. Juanita Craver for currently well controlled ulcerative colitis.  We discussed plan and will contact  her office regarding this since it would involve discontinuation of the current mesalamine treatment.   Chronic pain of both shoulders - Plan: XR Shoulder Left, XR Shoulder Right, Sedimentation rate, C-reactive protein  She is having persistent significant bilateral left greater than right shoulder pain but physical exam, ultrasound, and x-rays are unremarkable today.  This could represent axial joint inflammation versus could be musculotendinous symptoms.   High risk medication use - Plan: CBC with Differential/Platelet, COMPLETE METABOLIC PANEL WITH GFR, Glucose 6 phosphate dehydrogenase  We will check baseline CBC CMP and G6PD function anticipating sulfasalazine treatment.   Orders: Orders Placed This Encounter  Procedures  . XR Shoulder Left  . XR Shoulder Right  . XR Pelvis 1-2 Views  . XR Lumbar Spine 2-3 Views  . Sedimentation rate  . C-reactive protein  . CBC with Differential/Platelet  . COMPLETE METABOLIC PANEL WITH GFR  . Glucose 6 phosphate dehydrogenase   No orders of the defined types were placed in this encounter.   Follow-Up Instructions: No follow-ups on file.   Collier Salina, MD  Note - This record has been created using [employer].  Chart creation errors have been sought, but may not always  have been located. Such creation  errors do not reflect on  the standard of medical care.

## 2020-09-03 LAB — COMPLETE METABOLIC PANEL WITH GFR
AG Ratio: 1.5 (calc) (ref 1.0–2.5)
ALT: 10 U/L (ref 6–29)
AST: 12 U/L (ref 10–30)
Albumin: 4.1 g/dL (ref 3.6–5.1)
Alkaline phosphatase (APISO): 30 U/L — ABNORMAL LOW (ref 31–125)
BUN: 7 mg/dL (ref 7–25)
CO2: 27 mmol/L (ref 20–32)
Calcium: 9.1 mg/dL (ref 8.6–10.2)
Chloride: 103 mmol/L (ref 98–110)
Creat: 0.83 mg/dL (ref 0.50–1.10)
GFR, Est African American: 100 mL/min/{1.73_m2} (ref >=60)
GFR, Est Non African American: 86 mL/min/{1.73_m2} (ref >=60)
Globulin: 2.8 g/dL (calc) (ref 1.9–3.7)
Glucose, Bld: 86 mg/dL (ref 65–99)
Potassium: 4.4 mmol/L (ref 3.5–5.3)
Sodium: 136 mmol/L (ref 135–146)
Total Bilirubin: 0.6 mg/dL (ref 0.2–1.2)
Total Protein: 6.9 g/dL (ref 6.1–8.1)

## 2020-09-03 LAB — CBC WITH DIFFERENTIAL/PLATELET
Absolute Monocytes: 459 cells/uL (ref 200–950)
Basophils Absolute: 28 cells/uL (ref 0–200)
Basophils Relative: 0.5 %
Eosinophils Absolute: 112 cells/uL (ref 15–500)
Eosinophils Relative: 2 %
HCT: 38.8 % (ref 35.0–45.0)
Hemoglobin: 12.5 g/dL (ref 11.7–15.5)
Lymphs Abs: 1585 cells/uL (ref 850–3900)
MCH: 30.7 pg (ref 27.0–33.0)
MCHC: 32.2 g/dL (ref 32.0–36.0)
MCV: 95.3 fL (ref 80.0–100.0)
MPV: 11.3 fL (ref 7.5–12.5)
Monocytes Relative: 8.2 %
Neutro Abs: 3416 cells/uL (ref 1500–7800)
Neutrophils Relative %: 61 %
Platelets: 230 10*3/uL (ref 140–400)
RBC: 4.07 10*6/uL (ref 3.80–5.10)
RDW: 11 % (ref 11.0–15.0)
Total Lymphocyte: 28.3 %
WBC: 5.6 10*3/uL (ref 3.8–10.8)

## 2020-09-03 LAB — GLUCOSE 6 PHOSPHATE DEHYDROGENASE: G-6PDH: 8.9 U/g Hgb (ref 7.0–20.5)

## 2020-09-03 LAB — SEDIMENTATION RATE: Sed Rate: 2 mm/h (ref 0–20)

## 2020-09-03 LAB — C-REACTIVE PROTEIN: CRP: 1.2 mg/L (ref <8.0)

## 2020-09-10 NOTE — Progress Notes (Signed)
Lab results show normal CBC, CMP, no significant inflammatory markers elevation. G6PD normal. I spoke with Dr. Collene Mares who has no specific concerns with changing medication as we discussed in clinic.

## 2020-09-13 NOTE — Progress Notes (Signed)
Office Visit Note  Patient: Whitney Jenkins             Date of Birth: 1976/05/06           MRN: [id number]             PCP: Lilian Coma., MD Referring: Lilian Coma., MD Visit Date: 09/14/2020   Subjective:  No chief complaint on file.   History of Present Illness: Whitney Jenkins is a 44 y.o. female here for follow up of enteropathy associated arthritis currently taking mesalamine 2.4 daily with controlled UC symptoms. She was originally referred due to episodic costochondritis but also having persistent pains in the left shoulder. Imaging at her last visit showed appearance of R>L sacroiliitis otherwise no significant arthritis changes and inflammatory lab tests were not significantly elevated. Since the last visit no symptom changes her only complaint today is left shoulder pain.  Lilian Coma, [address], [place] [address]  Phone: [phone]  Fax: [phone]   Review of Systems  Constitutional: Negative for fatigue.  HENT: Negative for mouth sores, mouth dryness and nose dryness.   Eyes: Negative for pain, itching and dryness.  Respiratory: Negative for shortness of breath and difficulty breathing.   Cardiovascular: Negative for chest pain and palpitations.  Gastrointestinal: Negative for blood in stool, constipation and diarrhea.  Endocrine: Negative for increased urination.  Genitourinary: Negative for difficulty urinating.  Musculoskeletal: Positive for arthralgias and joint pain. Negative for joint swelling, myalgias, morning stiffness, muscle tenderness and myalgias.  Skin: Negative for color change, rash and redness.  Allergic/Immunologic: Negative for susceptible to infections.  Neurological: Negative for dizziness, numbness, headaches, memory loss and weakness.  Hematological: Negative for bruising/bleeding tendency.  Psychiatric/Behavioral: Negative for confusion.    PMFS History:  Patient Active Problem List   Diagnosis Date  Noted  . Costochondritis 08/31/2020  . Bilateral shoulder pain 08/31/2020  . Low back pain 08/31/2020  . High risk medication use 08/31/2020  . Sacroiliitis, not elsewhere classified ([place]) 08/31/2020  . Chronic chest wall pain 07/04/2020  . History of bilateral oophorectomies 07/04/2020  . Low TSH level 03/21/2020  . Family history of thyroid disease in sister 03/21/2020  . Leukopenia 12/11/2017  . Vitamin B12 deficiency 12/11/2017  . Ulcerative colitis, unspecified, without complications ([place]) 82/70/7867  . Uterine fibroid 11/06/2015  . Genetic testing 02/22/2015  . BRCA2 positive 02/22/2015  . FH: BRCA2 gene positive   . Family history of breast cancer   . Family history of breast cancer in female   . Family history of pancreatic cancer   . Chronic cholecystitis 02/17/2014    Past Medical History:  Diagnosis Date  . Anemia    was on iron pills but states she has been off for a while  . Chronic chest wall pain   . Family history of breast cancer   . Family history of breast cancer in female   . Family history of pancreatic cancer   . FH: BRCA2 gene positive   . GERD (gastroesophageal reflux disease)    takes Omeprazole daily  . History of migraine    last one 2wks ago  . Leukopenia 12/11/2017  . Ulcerative colitis ([place])    Followed by Dr. Collene Mares  . Vitamin B12 deficiency 12/11/2017    Family History  Problem Relation Age of Onset  . Breast cancer Mother 31       recurrance at 45  . BRCA 1/2 Mother  BRCA2 +  . Congestive Heart Failure Father   . COPD Father   . Lung cancer Father   . BRCA 1/2 Sister        BRCA2+  . Graves' disease Sister   . Breast cancer Maternal Aunt   . Colon cancer Maternal Aunt   . Breast cancer Maternal Uncle   . Lung cancer Paternal Uncle   . Lung cancer Maternal Grandmother        smoker  . Lung cancer Maternal Grandfather        smoker  . Pancreatic cancer Maternal Uncle   . Stomach cancer Maternal Uncle    Past Surgical History:   Procedure Laterality Date  . CHOLECYSTECTOMY N/A 02/22/2014   Procedure: LAPAROSCOPIC CHOLECYSTECTOMY;  Surgeon: Harl Bowie, MD;  Location: [facility];  Service: General;  Laterality: N/A;  . KNEE ARTHROSCOPY Left   . LAPAROSCOPIC BILATERAL SALPINGO OOPHERECTOMY Bilateral 10/20/2018   Procedure: LAPAROSCOPIC BILATERAL SALPINGO OOPHORECTOMY;  Surgeon: Linda Hedges, DO;  Location: [facility];  Service: Gynecology;  Laterality: Bilateral;  . MYOMECTOMY     Social History   Social History Narrative  . Not on file   Immunization History  Administered Date(s) Administered  . Influenza,inj,Quad PF,6+ Mos 10/09/2016, 06/26/2020  . PFIZER SARS-COV-2 Vaccination 12/29/2019, 01/23/2020, 09/06/2020  . Tdap 11/24/2011     Objective: Vital Signs: BP 100/70 (BP Location: Left Arm, Patient Position: Sitting, Cuff Size: Normal)   Pulse 61   Resp 14   Ht 5' 9"  (1.753 m)   Wt 160 lb 12.8 oz (72.9 kg)   LMP 09/26/2018 (Approximate)   BMI 23.75 kg/m    Physical Exam Skin:    General: Skin is warm and dry.     Findings: No rash.  Neurological:     Mental Status: She is alert.      Musculoskeletal Exam:  Left shoulder pain with overhead abduction, internal rotation focal tenderness over anterior aspect and bicipital groove no swelling seen, right shoulder normal   Investigation: No additional findings.  Imaging: XR Lumbar Spine 2-3 Views  Result Date: 08/31/2020 X-ray lumbar spine 2 views Intervertebral disc spaces are intact.  Possible very early anterior endplate osteophytes starting at levels L3-L5.  Some straightening of the lumbar lordosis.  No syndesmophytes or bridging osteophytes are seen. Impression No significant arthritis abnormality seen  XR Pelvis 1-2 Views  Result Date: 08/31/2020 X-ray pelvis 2 views AP and Ferguson Sacroiliac joints are patent bilaterally but with right greater than left iliac side sclerosis especially in the upper two thirds portion.   Hip joints do not show significant arthritis changes bilaterally.  Otherwise bone mineralization appears normal. Impression Findings suggest right greater than left asymmetric sacroiliitis without erosion or fusion  XR Shoulder Left  Result Date: 08/31/2020 X-ray left shoulder 4 views Normal appearance of glenohumeral and acromioclavicular joints.  No significant sclerosis or erosion seen.  No visible effusions seen.  No soft tissue calcifications or swelling. Impression Normal-appearing shoulder x-ray  XR Shoulder Right  Result Date: 08/31/2020 X-ray right shoulder 4 views Glenohumeral and acromioclavicular joints appear normal.  No sclerosis or erosions are seen.  No visible joint effusions.  No soft tissues calcifications or swelling seen. Impression Normal appearing shoulder x-ray   Recent Labs: Lab Results  Component Value Date   WBC 5.6 08/31/2020   HGB 12.5 08/31/2020   PLT 230 08/31/2020   NA 136 08/31/2020   K 4.4 08/31/2020   CL 103 08/31/2020  CO2 27 08/31/2020   GLUCOSE 86 08/31/2020   BUN 7 08/31/2020   CREATININE 0.83 08/31/2020   BILITOT 0.6 08/31/2020   ALKPHOS 39 10/28/2019   AST 12 08/31/2020   ALT 10 08/31/2020   PROT 6.9 08/31/2020   ALBUMIN 4.1 10/28/2019   CALCIUM 9.1 08/31/2020   GFRAA 100 08/31/2020    Speciality Comments: No specialty comments available.  Procedures:  No procedures performed Allergies: Percocet [oxycodone-acetaminophen]   Assessment / Plan:     Visit Diagnoses: Sacroiliitis, not elsewhere classified ([place]) -  Ulcerative pancolitis without complication ([place]) Costochondritis - Plan: sulfaSALAzine (AZULFIDINE) 500 MG tablet  We will start SSZ 1027m BID which would be comparable to 1.2 mesalamine bioavailability. Discussed to contact u[place]if having trouble taking the medicine especially reaction such as rashes. Goal is for possible improvement of low back pain and prevent recurring flares of costochondritis and also maintain good  control of UC symptoms. F/U in 4-6 weeks for labs after starting and symptom reassessment.  Left shoulder pain  Does not seem inflammatory no obvious rotator cuff change seen on limited ultrasound exam. Possible shoulder impingement syndrome no concerning features.  Orders: No orders of the defined types were placed in this encounter.  Meds ordered this encounter  Medications  . sulfaSALAzine (AZULFIDINE) 500 MG tablet    Sig: Take 2 tablets (1,000 mg total) by mouth 2 (two) times daily.    Dispense:  180 tablet    Refill:  0     Follow-Up Instructions: Return in about 5 weeks (around 10/19/2020) for UC with SI involvement and costochondritis episodes new SSZ f/u.   CCollier Salina MD  Note - This record has been created using [employer]  Chart creation errors have been sought, but may not always  have been located. Such creation errors do not reflect on  the standard of medical care.

## 2020-09-14 ENCOUNTER — Encounter: Payer: Self-pay | Admitting: Internal Medicine

## 2020-09-14 ENCOUNTER — Other Ambulatory Visit: Payer: Self-pay

## 2020-09-14 ENCOUNTER — Ambulatory Visit (INDEPENDENT_AMBULATORY_CARE_PROVIDER_SITE_OTHER): Payer: BC Managed Care – PPO | Admitting: Internal Medicine

## 2020-09-14 VITALS — BP 100/70 | HR 61 | Resp 14 | Ht 69.0 in | Wt 160.8 lb

## 2020-09-14 DIAGNOSIS — M940 Chondrocostal junction syndrome [Tietze]: Secondary | ICD-10-CM

## 2020-09-14 DIAGNOSIS — M461 Sacroiliitis, not elsewhere classified: Secondary | ICD-10-CM | POA: Diagnosis not present

## 2020-09-14 DIAGNOSIS — K5100 Ulcerative (chronic) pancolitis without complications: Secondary | ICD-10-CM

## 2020-09-14 MED ORDER — AZATHIOPRINE 50 MG PO TABS: 100.0000 mg | ORAL_TABLET | Freq: Every day | ORAL | 0 refills | Status: DC

## 2020-09-14 MED ORDER — SULFASALAZINE 500 MG PO TABS: 1000.0000 mg | ORAL_TABLET | Freq: Two times a day (BID) | ORAL | 0 refills | Status: DC

## 2020-10-09 ENCOUNTER — Encounter: Payer: Self-pay | Admitting: Radiology

## 2020-10-09 ENCOUNTER — Telehealth: Payer: Self-pay

## 2020-10-09 NOTE — Telephone Encounter (Signed)
Spoke with Whitney Jenkins she is having some headaches, nausea, scratchy throat with the SSZ. She tried reducing the dose to 1044m once daily instead of BID and this helped the nausea and throat somewhat but continues having headaches a few hours after taking the medication. She does not have a history of headaches normally. We discussed option to try different medication. She will go back onto mesalamine which was previously tolerated well for now. She has follow up planned with Dr. MCollene Maresto discuss treatment next month. I recommend we follow up with her after that time since she is going back onto previous medication for now.

## 2020-10-09 NOTE — Telephone Encounter (Signed)
Patient called stating since switching medications to Sulfasalazine she has been experiencing a constant headache and scratchy throat.  Patient states she also has nausea after she takes the medication.  Patient is requesting a return call.

## 2020-10-22 ENCOUNTER — Ambulatory Visit: Payer: BC Managed Care – PPO | Admitting: Internal Medicine

## 2020-10-28 ENCOUNTER — Other Ambulatory Visit: Payer: Self-pay | Admitting: Internal Medicine

## 2020-10-28 DIAGNOSIS — K5100 Ulcerative (chronic) pancolitis without complications: Secondary | ICD-10-CM

## 2020-10-28 DIAGNOSIS — M461 Sacroiliitis, not elsewhere classified: Secondary | ICD-10-CM

## 2020-10-28 DIAGNOSIS — M940 Chondrocostal junction syndrome [Tietze]: Secondary | ICD-10-CM

## 2020-10-28 NOTE — Telephone Encounter (Signed)
For Dr. Benjamine Mola

## 2020-10-29 NOTE — Telephone Encounter (Signed)
Last Visit: 09/14/2020 Next Visit: 11/26/2020 Labs: 08/31/2020 Lab results show normal CBC, CMP, no significant inflammatory markers elevation. G6PD normal. I spoke with Dr. Collene Mares who has no specific concerns with changing medication as we discussed in clinic.  Current Dose per office note 09/14/2020: sulfaSALAzine (AZULFIDINE) 500 MG tablet DX: Sacroiliitis, not elsewhere classified  Okay to refill Sulfasalazine?

## 2020-11-25 NOTE — Progress Notes (Deleted)
Office Visit Note  Patient: Whitney Jenkins             Date of Birth: 05-06-1976           MRN: [id number]             PCP: Lilian Coma., MD Referring: Lilian Coma., MD Visit Date: 11/26/2020   Subjective:  No chief complaint on file.   History of Present Illness: Afomia Blackley is a 45 y.o. 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 Sulfasalazine was started but did not tolerate this medication she takes mesalamine for IBD maintenance.***     No Rheumatology ROS completed.     History of Present Illness: Harli Engelken is a 45 y.o. female here for follow up of enteropathy associated arthritis currently taking mesalamine 2.4 daily with controlled UC symptoms. She was originally referred due to episodic costochondritis but also having persistent pains in the left shoulder. Imaging at her last visit showed appearance of R>L sacroiliitis otherwise no significant arthritis changes and inflammatory lab tests were not significantly elevated. Since the last visit no symptom changes her only complaint today is left shoulder pain.  PMFS History:  Patient Active Problem List   Diagnosis Date Noted  . Costochondritis 08/31/2020  . Bilateral shoulder pain 08/31/2020  . Low back pain 08/31/2020  . High risk medication use 08/31/2020  . Sacroiliitis, not elsewhere classified ([place]) 08/31/2020  . Chronic chest wall pain 07/04/2020  . History of bilateral oophorectomies 07/04/2020  . Low TSH level 03/21/2020  . Family history of thyroid disease in sister 03/21/2020  . Leukopenia 12/11/2017  . Vitamin B12 deficiency 12/11/2017  . Ulcerative colitis, unspecified, without complications ([place]) 15/83/0940  . Uterine fibroid 11/06/2015  . Genetic testing 02/22/2015  . BRCA2 positive 02/22/2015  . FH: BRCA2 gene positive   . Family history of breast cancer   . Family history of breast cancer in female   . Family history of pancreatic  cancer   . Chronic cholecystitis 02/17/2014    Past Medical History:  Diagnosis Date  . Anemia    was on iron pills but states she has been off for a while  . Chronic chest wall pain   . Family history of breast cancer   . Family history of breast cancer in female   . Family history of pancreatic cancer   . FH: BRCA2 gene positive   . GERD (gastroesophageal reflux disease)    takes Omeprazole daily  . History of migraine    last one 2wks ago  . Leukopenia 12/11/2017  . Ulcerative colitis ([place])    Followed by Dr. Collene Mares  . Vitamin B12 deficiency 12/11/2017    Family History  Problem Relation Age of Onset  . Breast cancer Mother 94       recurrance at 20  . BRCA 1/2 Mother        BRCA2 +  . Congestive Heart Failure Father   . COPD Father   . Lung cancer Father   . BRCA 1/2 Sister        BRCA2+  . Graves' disease Sister   . Breast cancer Maternal Aunt   . Colon cancer Maternal Aunt   . Breast cancer Maternal Uncle   . Lung cancer Paternal Uncle   . Lung cancer Maternal Grandmother        smoker  . Lung cancer Maternal Grandfather        smoker  .  Pancreatic cancer Maternal Uncle   . Stomach cancer Maternal Uncle    Past Surgical History:  Procedure Laterality Date  . CHOLECYSTECTOMY N/A 02/22/2014   Procedure: LAPAROSCOPIC CHOLECYSTECTOMY;  Surgeon: Harl Bowie, MD;  Location: [facility];  Service: General;  Laterality: N/A;  . KNEE ARTHROSCOPY Left   . LAPAROSCOPIC BILATERAL SALPINGO OOPHERECTOMY Bilateral 10/20/2018   Procedure: LAPAROSCOPIC BILATERAL SALPINGO OOPHORECTOMY;  Surgeon: Linda Hedges, DO;  Location: [facility];  Service: Gynecology;  Laterality: Bilateral;  . MYOMECTOMY     Social History   Social History Narrative  . Not on file   Immunization History  Administered Date(s) Administered  . Influenza,inj,Quad PF,6+ Mos 10/09/2016, 06/26/2020  . PFIZER(Purple Top)SARS-COV-2 Vaccination 12/29/2019, 01/23/2020, 09/06/2020  . Tdap  11/24/2011     Objective: Vital Signs: LMP 09/26/2018 (Approximate)    Physical Exam   Musculoskeletal Exam: ***  CDAI Exam: CDAI Score: -- Patient Global: --; Provider Global: -- Swollen: --; Tender: -- Joint Exam 11/26/2020   No joint exam has been documented for this visit   There is currently no information documented on the homunculus. Go to the Rheumatology activity and complete the homunculus joint exam.  Investigation: No additional findings.  Imaging: No results found.  Recent Labs: Lab Results  Component Value Date   WBC 5.6 08/31/2020   HGB 12.5 08/31/2020   PLT 230 08/31/2020   NA 136 08/31/2020   K 4.4 08/31/2020   CL 103 08/31/2020   CO2 27 08/31/2020   GLUCOSE 86 08/31/2020   BUN 7 08/31/2020   CREATININE 0.83 08/31/2020   BILITOT 0.6 08/31/2020   ALKPHOS 39 10/28/2019   AST 12 08/31/2020   ALT 10 08/31/2020   PROT 6.9 08/31/2020   ALBUMIN 4.1 10/28/2019   CALCIUM 9.1 08/31/2020   GFRAA 100 08/31/2020    Speciality Comments: No specialty comments available.  Procedures:  No procedures performed Allergies: Percocet [oxycodone-acetaminophen] and Sulfasalazine   Assessment / Plan:     Visit Diagnoses: No diagnosis found.  ***  Orders: No orders of the defined types were placed in this encounter.  No orders of the defined types were placed in this encounter.    Follow-Up Instructions: No follow-ups on file.   Collier Salina, MD  Note - This record has been created using [employer].  Chart creation errors have been sought, but may not always  have been located. Such creation errors do not reflect on  the standard of medical care.

## 2020-11-26 ENCOUNTER — Ambulatory Visit: Payer: BC Managed Care – PPO | Admitting: Internal Medicine

## 2020-12-04 ENCOUNTER — Other Ambulatory Visit: Payer: Self-pay | Admitting: Gastroenterology

## 2020-12-04 DIAGNOSIS — R1012 Left upper quadrant pain: Secondary | ICD-10-CM

## 2020-12-04 DIAGNOSIS — R110 Nausea: Secondary | ICD-10-CM

## 2020-12-16 NOTE — Progress Notes (Signed)
Office Visit Note  Patient: Whitney Jenkins             Date of Birth: 1976/09/25           MRN: [id number]             PCP: Lilian Coma., MD Referring: Lilian Coma., MD Visit Date: 12/17/2020   Subjective:  Follow-up (Doing good)   History of Present Illness: Whitney Jenkins is a 45 y.o. female with ulcerative colitis vurrently controlled on mesalamine 2.4 daily here for follow up for suspected enteropathy associated arthritis. She did not tolerate switching to sulfasalazine for possibly more systemic antiinflammatory effect. She is doing fairly well overall but the pain in her chest and left shoulder persist. The left shoulder is doing a bit worse than before and she has been referred to orthopedics surgery to investigate this. Her recent colonoscopy did not show any active disease and inflammatory markers have been normal.   Review of Systems  Constitutional: Positive for fatigue.  Eyes: Negative for pain and redness.  Respiratory: Negative for difficulty breathing.   Cardiovascular: Positive for chest pain.  Musculoskeletal: Positive for arthralgias and joint pain.  Skin: Negative for rash.     Previous HPI: Whitney Jenkins is a 45 y.o. female here for follow up of enteropathy associated arthritis currently taking mesalamine 2.4 daily with controlled UC symptoms. She was originally referred due to episodic costochondritis but also having persistent pains in the left shoulder. Imaging at her last visit showed appearance of R>L sacroiliitis otherwise no significant arthritis changes and inflammatory lab tests were not significantly elevated. Since the last visit no symptom changes her only complaint today is left shoulder pain.   PMFS History:  Patient Active Problem List   Diagnosis Date Noted  . Costochondritis 08/31/2020  . Bilateral shoulder pain 08/31/2020  . Low back pain 08/31/2020  . High risk medication use 08/31/2020  . Sacroiliitis, not elsewhere classified ([place])  08/31/2020  . Chronic chest wall pain 07/04/2020  . History of bilateral oophorectomies 07/04/2020  . Low TSH level 03/21/2020  . Family history of thyroid disease in sister 03/21/2020  . Leukopenia 12/11/2017  . Vitamin B12 deficiency 12/11/2017  . Ulcerative colitis, unspecified, without complications ([place]) 01/77/9390  . Uterine fibroid 11/06/2015  . Genetic testing 02/22/2015  . BRCA2 positive 02/22/2015  . FH: BRCA2 gene positive   . Family history of breast cancer   . Family history of breast cancer in female   . Family history of pancreatic cancer   . Chronic cholecystitis 02/17/2014    Past Medical History:  Diagnosis Date  . Anemia    was on iron pills but states she has been off for a while  . Chronic chest wall pain   . Family history of breast cancer   . Family history of breast cancer in female   . Family history of pancreatic cancer   . FH: BRCA2 gene positive   . GERD (gastroesophageal reflux disease)    takes Omeprazole daily  . History of migraine    last one 2wks ago  . Leukopenia 12/11/2017  . Ulcerative colitis ([place])    Followed by Dr. Collene Mares  . Vitamin B12 deficiency 12/11/2017    Family History  Problem Relation Age of Onset  . Breast cancer Mother 67       recurrance at 70  . BRCA 1/2 Mother        BRCA2 +  . Congestive Heart Failure Father   .  COPD Father   . Lung cancer Father   . BRCA 1/2 Sister        BRCA2+  . Graves' disease Sister   . Breast cancer Maternal Aunt   . Colon cancer Maternal Aunt   . Breast cancer Maternal Uncle   . Lung cancer Paternal Uncle   . Lung cancer Maternal Grandmother        smoker  . Lung cancer Maternal Grandfather        smoker  . Pancreatic cancer Maternal Uncle   . Stomach cancer Maternal Uncle    Past Surgical History:  Procedure Laterality Date  . CHOLECYSTECTOMY N/A 02/22/2014   Procedure: LAPAROSCOPIC CHOLECYSTECTOMY;  Surgeon: Harl Bowie, MD;  Location: [facility];  Service: General;  Laterality:  N/A;  . KNEE ARTHROSCOPY Left   . LAPAROSCOPIC BILATERAL SALPINGO OOPHERECTOMY Bilateral 10/20/2018   Procedure: LAPAROSCOPIC BILATERAL SALPINGO OOPHORECTOMY;  Surgeon: Linda Hedges, DO;  Location: [facility];  Service: Gynecology;  Laterality: Bilateral;  . MYOMECTOMY     Social History   Social History Narrative  . Not on file   Immunization History  Administered Date(s) Administered  . Influenza,inj,Quad PF,6+ Mos 10/09/2016, 06/26/2020  . PFIZER(Purple Top)SARS-COV-2 Vaccination 12/29/2019, 01/23/2020, 09/06/2020  . Tdap 11/24/2011     Objective: Vital Signs: BP 100/66 (BP Location: Left Arm, Patient Position: Sitting, Cuff Size: Normal)   Pulse 82   Resp 14   Ht 5' 9"  (1.753 m)   Wt 149 lb (67.6 kg)   LMP 09/26/2018 (Approximate)   BMI 22.00 kg/m    Physical Exam Eyes:     Conjunctiva/sclera: Conjunctivae normal.  Cardiovascular:     Rate and Rhythm: Normal rate and regular rhythm.  Pulmonary:     Effort: Pulmonary effort is normal.     Breath sounds: Normal breath sounds.  Skin:    General: Skin is warm and dry.     Findings: No rash.  Neurological:     General: No focal deficit present.     Mental Status: She is alert.  Psychiatric:        Mood and Affect: Mood normal.     Musculoskeletal Exam:  Neck full ROM no tenderness Shoulders right side normal left side painful arc and tenderness to pressure medial to bicipital groove Chest wall tenderness to pressure along costochondral joint areas Elbows full ROM no tenderness or swelling Wrists full ROM no tenderness or swelling Fingers full ROM no tenderness or swelling Knees full ROM no tenderness or swelling   Investigation: No additional findings.  Imaging: No results found.  Recent Labs: Lab Results  Component Value Date   WBC 5.6 08/31/2020   HGB 12.5 08/31/2020   PLT 230 08/31/2020   NA 136 08/31/2020   K 4.4 08/31/2020   CL 103 08/31/2020   CO2 27 08/31/2020   GLUCOSE 86  08/31/2020   BUN 7 08/31/2020   CREATININE 0.83 08/31/2020   BILITOT 0.6 08/31/2020   ALKPHOS 39 10/28/2019   AST 12 08/31/2020   ALT 10 08/31/2020   PROT 6.9 08/31/2020   ALBUMIN 4.1 10/28/2019   CALCIUM 9.1 08/31/2020   GFRAA 100 08/31/2020    Speciality Comments: No specialty comments available.  Procedures:  No procedures performed Allergies: Nsaids, Oxycodone-acetaminophen, Sulfasalazine, and Percocet [oxycodone-acetaminophen]   Assessment / Plan:     Visit Diagnoses: Costochondritis Sacroiliitis, not elsewhere classified ([place])  She still hs similar chest pain without improvement nor worsening, not complaining of low back pain  much. Discussed no objective findings for inflammation at this time, and symptoms are not terrible so risks of additional immunosuppression are not worthwhile. Will f/u in case changes develop.  Ulcerative pancolitis without complication ([facility])  UC appears to be well controlled at this time based on colonoscopy but has some ongoing symptoms. Although inflammatory markers are also normal. Continuing mesalamine at this time. She is scheduled for CT abdomen/pelvis on 3/24.  Chronic pain of both shoulders  Currently left shoulder pain this appears likely a rotator cuff tendon source of pain. She has upcoming evaluation with orthopedic surgery  Orders: No orders of the defined types were placed in this encounter.  No orders of the defined types were placed in this encounter.    Follow-Up Instructions: Return in about 6 months (around 06/19/2021) for IBD, joint pain.   Collier Salina, MD  Note - This record has been created using [employer].  Chart creation errors have been sought, but may not always  have been located. Such creation errors do not reflect on  the standard of medical care.

## 2020-12-17 ENCOUNTER — Ambulatory Visit (INDEPENDENT_AMBULATORY_CARE_PROVIDER_SITE_OTHER): Payer: BC Managed Care – PPO | Admitting: Internal Medicine

## 2020-12-17 ENCOUNTER — Encounter: Payer: Self-pay | Admitting: Internal Medicine

## 2020-12-17 ENCOUNTER — Other Ambulatory Visit: Payer: Self-pay

## 2020-12-17 VITALS — BP 100/66 | HR 82 | Resp 14 | Ht 69.0 in | Wt 149.0 lb

## 2020-12-17 DIAGNOSIS — M461 Sacroiliitis, not elsewhere classified: Secondary | ICD-10-CM

## 2020-12-17 DIAGNOSIS — K5100 Ulcerative (chronic) pancolitis without complications: Secondary | ICD-10-CM | POA: Diagnosis not present

## 2020-12-17 DIAGNOSIS — M25512 Pain in left shoulder: Secondary | ICD-10-CM

## 2020-12-17 DIAGNOSIS — M940 Chondrocostal junction syndrome [Tietze]: Secondary | ICD-10-CM | POA: Diagnosis not present

## 2020-12-17 DIAGNOSIS — M25511 Pain in right shoulder: Secondary | ICD-10-CM | POA: Diagnosis not present

## 2020-12-17 DIAGNOSIS — G8929 Other chronic pain: Secondary | ICD-10-CM

## 2020-12-20 ENCOUNTER — Ambulatory Visit
Admission: RE | Admit: 2020-12-20 | Discharge: 2020-12-20 | Disposition: A | Discharge status: Home / Self Care | Payer: BC Managed Care – PPO | Source: Ambulatory Visit | Attending: Gastroenterology | Admitting: Gastroenterology

## 2020-12-20 ENCOUNTER — Other Ambulatory Visit: Payer: Self-pay

## 2020-12-20 DIAGNOSIS — R110 Nausea: Secondary | ICD-10-CM

## 2020-12-20 DIAGNOSIS — R1012 Left upper quadrant pain: Secondary | ICD-10-CM

## 2020-12-20 MED ORDER — IOPAMIDOL (ISOVUE-300) INJECTION 61%
100.0000 mL | Freq: Once | INTRAVENOUS | Status: AC | PRN
  Administered 2020-12-20: 100 mL via INTRAVENOUS

## 2021-01-04 ENCOUNTER — Other Ambulatory Visit: Payer: Self-pay | Admitting: Obstetrics & Gynecology

## 2021-01-04 ENCOUNTER — Other Ambulatory Visit: Payer: Self-pay | Admitting: Diagnostic Radiology

## 2021-01-04 DIAGNOSIS — Z1501 Genetic susceptibility to malignant neoplasm of breast: Secondary | ICD-10-CM

## 2021-01-19 ENCOUNTER — Ambulatory Visit
Admission: RE | Admit: 2021-01-19 | Discharge: 2021-01-19 | Disposition: A | Discharge status: Home / Self Care | Payer: BC Managed Care – PPO | Source: Ambulatory Visit | Attending: Obstetrics & Gynecology | Admitting: Obstetrics & Gynecology

## 2021-01-19 ENCOUNTER — Other Ambulatory Visit: Payer: Self-pay

## 2021-01-19 DIAGNOSIS — Z1501 Genetic susceptibility to malignant neoplasm of breast: Secondary | ICD-10-CM

## 2021-01-19 MED ORDER — GADOBUTROL 1 MMOL/ML IV SOLN
7.0000 mL | Freq: Once | INTRAVENOUS | Status: AC | PRN
  Administered 2021-01-19: 7 mL via INTRAVENOUS

## 2021-01-24 ENCOUNTER — Other Ambulatory Visit: Payer: Self-pay | Admitting: Obstetrics & Gynecology

## 2021-01-24 DIAGNOSIS — R9389 Abnormal findings on diagnostic imaging of other specified body structures: Secondary | ICD-10-CM

## 2021-01-25 ENCOUNTER — Ambulatory Visit
Admission: RE | Admit: 2021-01-25 | Discharge: 2021-01-25 | Disposition: A | Discharge status: Home / Self Care | Payer: BC Managed Care – PPO | Source: Ambulatory Visit | Attending: Obstetrics & Gynecology | Admitting: Obstetrics & Gynecology

## 2021-01-25 ENCOUNTER — Other Ambulatory Visit: Payer: Self-pay

## 2021-01-25 DIAGNOSIS — R9389 Abnormal findings on diagnostic imaging of other specified body structures: Secondary | ICD-10-CM

## 2021-06-16 NOTE — Progress Notes (Deleted)
Office Visit Note  Patient: Whitney Jenkins             Date of Birth: Nov 16, 1975           MRN: [id number]             PCP: Whitney Jenkins., MD Referring: Whitney Jenkins., MD Visit Date: 06/17/2021   Subjective:  No chief complaint on file.   History of Present Illness: Whitney Jenkins is a 45 y.o. female here for follow up chronic joint pains in the chest and shoulder with concern for IBD associated arthritis but has seen orthopedics in the interval for rotator cuff arthropathy.  She is on mesalamine with good disease control by most recent GI evaluation.***   Previous HPI 12/17/20 Whitney Jenkins is a 45 y.o. female with ulcerative colitis vurrently controlled on mesalamine 2.4 daily here for follow up for suspected enteropathy associated arthritis. She did not tolerate switching to sulfasalazine for possibly more systemic antiinflammatory effect. She is doing fairly well overall but the pain in her chest and left shoulder persist. The left shoulder is doing a bit worse than before and she has been referred to orthopedics surgery to investigate this. Her recent colonoscopy did not show any active disease and inflammatory markers have been normal.   No Rheumatology ROS completed.   PMFS History:  Patient Active Problem List   Diagnosis Date Noted   Costochondritis 08/31/2020   Bilateral shoulder pain 08/31/2020   Low back pain 08/31/2020   High risk medication use 08/31/2020   Sacroiliitis, not elsewhere classified ([place]) 08/31/2020   Chronic chest wall pain 07/04/2020   History of bilateral oophorectomies 07/04/2020   Low TSH level 03/21/2020   Family history of thyroid disease in sister 03/21/2020   Leukopenia 12/11/2017   Vitamin B12 deficiency 12/11/2017   Ulcerative colitis, unspecified, without complications ([place]) 18/56/3149   Uterine fibroid 11/06/2015   Genetic testing 02/22/2015   BRCA2 positive 02/22/2015   FH: BRCA2 gene positive    Family history of breast cancer     Family history of breast cancer in female    Family history of pancreatic cancer    Chronic cholecystitis 02/17/2014    Past Medical History:  Diagnosis Date   Anemia    was on iron pills but states she has been off for a while   Chronic chest wall pain    Family history of breast cancer    Family history of breast cancer in female    Family history of pancreatic cancer    FH: BRCA2 gene positive    GERD (gastroesophageal reflux disease)    takes Omeprazole daily   History of migraine    last one 2wks ago   Leukopenia 12/11/2017   Ulcerative colitis ([place])    Followed by Dr. Collene Jenkins   Vitamin B12 deficiency 12/11/2017    Family History  Problem Relation Age of Onset   Breast cancer Mother 56       recurrance at 73   BRCA 1/2 Mother        BRCA2 +   Congestive Heart Failure Father    COPD Father    Lung cancer Father    BRCA 1/2 Sister        Abbott Pao' disease Sister    Breast cancer Maternal Aunt    Colon cancer Maternal Aunt    Breast cancer Maternal Uncle    Lung cancer Paternal Uncle    Lung cancer Maternal Grandmother  smoker   Lung cancer Maternal Grandfather        smoker   Pancreatic cancer Maternal Uncle    Stomach cancer Maternal Uncle    Past Surgical History:  Procedure Laterality Date   CHOLECYSTECTOMY N/A 02/22/2014   Procedure: LAPAROSCOPIC CHOLECYSTECTOMY;  Surgeon: Whitney Bowie, MD;  Location: [facility];  Service: General;  Laterality: N/A;   KNEE ARTHROSCOPY Left    LAPAROSCOPIC BILATERAL SALPINGO OOPHERECTOMY Bilateral 10/20/2018   Procedure: LAPAROSCOPIC BILATERAL SALPINGO OOPHORECTOMY;  Surgeon: Whitney Hedges, DO;  Location: [facility];  Service: Gynecology;  Laterality: Bilateral;   MYOMECTOMY     Social History   Social History Narrative   Not on file   Immunization History  Administered Date(s) Administered   Influenza,inj,Quad PF,6+ Mos 10/09/2016, 06/26/2020   PFIZER(Purple Top)SARS-COV-2 Vaccination 12/29/2019,  01/23/2020, 09/06/2020   Tdap 11/24/2011     Objective: Vital Signs: LMP 09/26/2018 (Approximate)    Physical Exam   Musculoskeletal Exam: ***  CDAI Exam: CDAI Score: -- Patient Global: --; Provider Global: -- Swollen: --; Tender: -- Joint Exam 06/17/2021   No joint exam has been documented for this visit   There is currently no information documented on the homunculus. Go to the Rheumatology activity and complete the homunculus joint exam.  Investigation: No additional findings.  Imaging: No results found.  Recent Labs: Lab Results  Component Value Date   WBC 5.6 08/31/2020   HGB 12.5 08/31/2020   PLT 230 08/31/2020   NA 136 08/31/2020   K 4.4 08/31/2020   CL 103 08/31/2020   CO2 27 08/31/2020   GLUCOSE 86 08/31/2020   BUN 7 08/31/2020   CREATININE 0.83 08/31/2020   BILITOT 0.6 08/31/2020   ALKPHOS 39 10/28/2019   AST 12 08/31/2020   ALT 10 08/31/2020   PROT 6.9 08/31/2020   ALBUMIN 4.1 10/28/2019   CALCIUM 9.1 08/31/2020   GFRAA 100 08/31/2020    Speciality Comments: No specialty comments available.  Procedures:  No procedures performed Allergies: Nsaids, Oxycodone-acetaminophen, Sulfasalazine, and Percocet [oxycodone-acetaminophen]   Assessment / Plan:     Visit Diagnoses: No diagnosis found.  ***  Orders: No orders of the defined types were placed in this encounter.  No orders of the defined types were placed in this encounter.    Follow-Up Instructions: No follow-ups on file.   Whitney Salina, MD  Note - This record has been created using [employer].  Chart creation errors have been sought, but may not always  have been located. Such creation errors do not reflect on  the standard of medical care.

## 2021-06-17 ENCOUNTER — Ambulatory Visit: Payer: BC Managed Care – PPO | Admitting: Internal Medicine

## 2021-10-08 ENCOUNTER — Telehealth: Payer: Self-pay | Admitting: Medical

## 2021-10-08 NOTE — Telephone Encounter (Signed)
Dismissal letter in guarantor snapshot  °

## 2022-09-04 ENCOUNTER — Other Ambulatory Visit: Payer: Self-pay | Admitting: General Surgery

## 2022-09-04 DIAGNOSIS — Z1501 Genetic susceptibility to malignant neoplasm of breast: Secondary | ICD-10-CM

## 2022-09-27 ENCOUNTER — Ambulatory Visit
Admission: RE | Admit: 2022-09-27 | Discharge: 2022-09-27 | Disposition: A | Discharge status: Home / Self Care | Payer: BC Managed Care – PPO | Source: Ambulatory Visit | Attending: General Surgery | Admitting: General Surgery

## 2022-09-27 DIAGNOSIS — Z1501 Genetic susceptibility to malignant neoplasm of breast: Secondary | ICD-10-CM

## 2022-09-27 MED ORDER — GADOPICLENOL 0.5 MMOL/ML IV SOLN
8.0000 mL | Freq: Once | INTRAVENOUS | Status: AC | PRN
  Administered 2022-09-27: 8 mL via INTRAVENOUS

## 2022-10-01 ENCOUNTER — Other Ambulatory Visit: Payer: Self-pay | Admitting: General Surgery

## 2022-10-01 DIAGNOSIS — R9389 Abnormal findings on diagnostic imaging of other specified body structures: Secondary | ICD-10-CM

## 2022-10-08 ENCOUNTER — Ambulatory Visit
Admission: RE | Admit: 2022-10-08 | Discharge: 2022-10-08 | Disposition: A | Discharge status: Home / Self Care | Payer: No Typology Code available for payment source | Source: Ambulatory Visit | Attending: General Surgery | Admitting: General Surgery

## 2022-10-08 DIAGNOSIS — G8929 Other chronic pain: Secondary | ICD-10-CM

## 2022-10-08 DIAGNOSIS — Z1501 Genetic susceptibility to malignant neoplasm of breast: Secondary | ICD-10-CM

## 2022-10-08 DIAGNOSIS — R9389 Abnormal findings on diagnostic imaging of other specified body structures: Secondary | ICD-10-CM

## 2022-10-08 MED ORDER — GADOPICLENOL 0.5 MMOL/ML IV SOLN
7.0000 mL | Freq: Once | INTRAVENOUS | Status: AC | PRN
  Administered 2022-10-08: 7 mL via INTRAVENOUS

## 2022-10-14 ENCOUNTER — Other Ambulatory Visit: Payer: Self-pay | Admitting: General Surgery

## 2022-11-17 ENCOUNTER — Encounter (HOSPITAL_BASED_OUTPATIENT_CLINIC_OR_DEPARTMENT_OTHER): Payer: Self-pay | Admitting: General Surgery

## 2022-11-17 MED ORDER — ENSURE PRE-SURGERY PO LIQD: 296.0000 mL | Freq: Once | ORAL | Status: DC

## 2022-11-17 NOTE — Progress Notes (Signed)

## 2022-11-20 NOTE — H&P (Addendum)
78 yof lives in [place] and has insurance (office) job presented with BRCA 2 mutation.  She has family history of breast cancer in her mom and sister with mutation that led her to testing. The mutation is [id number].  She has undergone risk reducing SO with Dr Lynnette Caffey.already.  She has no complaints referable to either breast.  She has an MRI in April of 2022 that is negative with d density breasts.  She has mm at [facility] in January she reports as negative. She did have an MRI in 1/24 that shows linear nme that was biopsy negative.    She has seen reconstruction with her family and does not desire a plastic consult. Review of Systems: A complete review of systems was obtained from the patient.  I have reviewed this information and discussed as appropriate with the patient.  See HPI as well for other ROS.   Review of Systems  All other systems reviewed and are negative.       Medical History: Past Medical History      Past Medical History:  Diagnosis Date   Arthritis     GERD (gastroesophageal reflux disease)             Patient Active Problem List  Diagnosis   BRCA2 positive      Past Surgical History       Past Surgical History:  Procedure Laterality Date   CHOLECYSTECTOMY       MASTECTOMY            Allergies       Allergies  Allergen Reactions   Nsaids (Non-Steroidal Anti-Inflammatory Drug) Other (See Comments)      Other reaction(s): Other Ulcerative colitis Ulcerative colitis     Oxycodone-Acetaminophen Other (See Comments), Nausea And Vomiting and Nausea      Other reaction(s): GI Upset (intolerance), Unknown     Sulfasalazine Other (See Comments)      Headaches and nausea Headaches and nausea Headaches and nausea Headaches and nausea Headaches and nausea Headaches and nausea                Current Outpatient Medications on File Prior to Visit  Medication Sig Dispense Refill   acetaminophen (TYLENOL) 325 MG tablet Take by mouth every 6 (six) hours        cyanocobalamin (VITAMIN B12) 500 MCG tablet Take 1 tablet (500 mcg total) by mouth once daily       estradioL (CLIMARA) 0.1 mg/24 hr patch estradiol 0.1 mg/24 hr weekly transdermal patch  APPLY 1 PATCH BY TRANSDERMAL ROUTE EVERY WEEK       etonogestreL-ethinyl estradioL (NUVARING) 0.12-0.015 mg/24 hr vaginal ring etonogestrel 0.12 mg-ethinyl estradiol 0.015 mg/24 hr vaginal ring  INSERT 1 RING VAGINALLY AS DIRECTED. REMOVE AFTER 3 WEEKS & WAIT 7 DAYS BEFORE INSERTING A NEW RING       gabapentin (NEURONTIN) 100 MG capsule gabapentin 100 mg capsule  TAKE 1 CAPSULE BY MOUTH THREE TIMES A DAY       linaCLOtide (LINZESS) 72 mcg capsule Linzess 72 mcg capsule  TAKE 1 CAPSULE BY MOUTH EVERY DAY FOR 90 DAYS       medroxyPROGESTERone (PROVERA) 5 MG tablet medroxyprogesterone 5 mg tablet       mesalamine (CANASA) 1000 MG suppository mesalamine 1,000 mg rectal suppository  INSERT 1 SUPPOSITORY RECTALLY EVERY DAY AT BEDTIME       norelgestromin-ethinyl estradiol (XULANE) 150-35 mcg/24 hr patch Xulane 150 mcg-35 mcg/24 hr transdermal patch  APPLY 1 PATCH  EVERY WEEK, NO OFF WEEK FOR MENSES       omeprazole (PRILOSEC) 40 MG DR capsule omeprazole 40 mg capsule,delayed release       progesterone (PROMETRIUM) 200 MG capsule progesterone micronized 200 mg capsule  TAKE 1 CAPSULE BY MOUTH EVERYDAY AT BEDTIME        No current facility-administered medications on file prior to visit.      Family History       Family History  Problem Relation Age of Onset   High blood pressure (Hypertension) Mother     Breast cancer Mother     Skin cancer Father     Coronary Artery Disease (Blocked arteries around heart) Father     Deep vein thrombosis (DVT or abnormal blood clot formation) Father          Social History        Tobacco Use  Smoking Status Former   Types: Cigarettes   Quit date: 2019   Years since quitting: 4.9  Smokeless Tobacco Never      Social History  Social History          Socioeconomic History   Marital status: Single  Tobacco Use   Smoking status: Former      Types: Cigarettes      Quit date: 2019      Years since quitting: 4.9   Smokeless tobacco: Never  Substance and Sexual Activity   Drug use: Never        Objective:      There were no vitals filed for this visit.  There is no height or weight on file to calculate BMI.   Physical Exam Vitals reviewed.  Constitutional:      Appearance: Normal appearance.  Chest:  Breasts:    Right: No inverted nipple, mass or nipple discharge.     Left: No inverted nipple, mass or nipple discharge.  Lymphadenopathy:     Upper Body:     Right upper body: No supraclavicular or axillary adenopathy.     Left upper body: No supraclavicular or axillary adenopathy.  Neurological:     Mental Status: She is alert.        Assessment and Plan:     Diagnoses and all orders for this visit:   BRCA2 positive   bilateral risk reducing mastectomies (no reconstruction)   We discussed her risk of breast cancer is upwards of 45% and up to 80% based on data. Options are intensive screening with mm and mri staggered. This would not prevent cancer and its treattment but would identify if early.   The other option is risk reducing surgery with mastectomies.  She understands this may not have impact on survival but certainly would decrease risk of cancer to low single digits. She understands it is not 100% preventive.  She does not desire reconstruction.  We discussed bilateral mastectomies with inverted T incision vs transverse with attempt to do flat closure afterwards. Discussed risks, drains, overnight stay and recovery. She would like to proceed with surgery now

## 2022-11-24 ENCOUNTER — Ambulatory Visit (HOSPITAL_BASED_OUTPATIENT_CLINIC_OR_DEPARTMENT_OTHER): Payer: No Typology Code available for payment source | Admitting: Anesthesiology

## 2022-11-24 ENCOUNTER — Encounter (HOSPITAL_BASED_OUTPATIENT_CLINIC_OR_DEPARTMENT_OTHER)
Admission: RE | Disposition: A | Discharge status: Home / Self Care | Payer: Self-pay | Source: Home / Self Care | Attending: General Surgery

## 2022-11-24 ENCOUNTER — Other Ambulatory Visit: Payer: Self-pay

## 2022-11-24 ENCOUNTER — Encounter (HOSPITAL_BASED_OUTPATIENT_CLINIC_OR_DEPARTMENT_OTHER): Payer: Self-pay | Admitting: General Surgery

## 2022-11-24 ENCOUNTER — Observation Stay (HOSPITAL_BASED_OUTPATIENT_CLINIC_OR_DEPARTMENT_OTHER)
Admission: RE | Admit: 2022-11-24 | Discharge: 2022-11-25 | Disposition: A | Discharge status: Home / Self Care | Payer: No Typology Code available for payment source | Attending: General Surgery | Admitting: General Surgery

## 2022-11-24 DIAGNOSIS — Z4001 Encounter for prophylactic removal of breast: Secondary | ICD-10-CM | POA: Diagnosis not present

## 2022-11-24 DIAGNOSIS — Z87891 Personal history of nicotine dependence: Secondary | ICD-10-CM | POA: Diagnosis not present

## 2022-11-24 DIAGNOSIS — Z9013 Acquired absence of bilateral breasts and nipples: Secondary | ICD-10-CM

## 2022-11-24 DIAGNOSIS — Z1501 Genetic susceptibility to malignant neoplasm of breast: Principal | ICD-10-CM | POA: Insufficient documentation

## 2022-11-24 DIAGNOSIS — Z01818 Encounter for other preprocedural examination: Secondary | ICD-10-CM

## 2022-11-24 HISTORY — PX: TOTAL MASTECTOMY: SHX6129

## 2022-11-24 SURGERY — MASTECTOMY, SIMPLE
Anesthesia: General | Site: Breast | Laterality: Bilateral

## 2022-11-24 MED ORDER — SIMETHICONE 80 MG PO CHEW: 40.0000 mg | CHEWABLE_TABLET | Freq: Four times a day (QID) | ORAL | Status: DC | PRN

## 2022-11-24 MED ORDER — CEFAZOLIN SODIUM-DEXTROSE 2-4 GM/100ML-% IV SOLN
INTRAVENOUS | Status: AC
  Filled 2022-11-24: qty 100

## 2022-11-24 MED ORDER — PROPOFOL 10 MG/ML IV BOLUS
INTRAVENOUS | Status: DC | PRN
  Administered 2022-11-24: 200 mg via INTRAVENOUS

## 2022-11-24 MED ORDER — MIDAZOLAM HCL 2 MG/2ML IJ SOLN
INTRAMUSCULAR | Status: AC
  Filled 2022-11-24: qty 2

## 2022-11-24 MED ORDER — TRAMADOL HCL 50 MG PO TABS: 50.0000 mg | ORAL_TABLET | Freq: Four times a day (QID) | ORAL | Status: DC | PRN

## 2022-11-24 MED ORDER — ONDANSETRON HCL 4 MG/2ML IJ SOLN
INTRAMUSCULAR | Status: DC | PRN
  Administered 2022-11-24: 4 mg via INTRAVENOUS

## 2022-11-24 MED ORDER — FENTANYL CITRATE (PF) 100 MCG/2ML IJ SOLN
INTRAMUSCULAR | Status: AC
  Filled 2022-11-24: qty 2

## 2022-11-24 MED ORDER — ONDANSETRON 4 MG PO TBDP
4.0000 mg | ORAL_TABLET | Freq: Four times a day (QID) | ORAL | Status: DC | PRN
  Administered 2022-11-24: 4 mg via ORAL
  Filled 2022-11-24: qty 1

## 2022-11-24 MED ORDER — ACETAMINOPHEN 500 MG PO TABS
1000.0000 mg | ORAL_TABLET | Freq: Four times a day (QID) | ORAL | Status: DC
  Administered 2022-11-24 – 2022-11-25 (×3): 1000 mg via ORAL
  Filled 2022-11-24 (×3): qty 2

## 2022-11-24 MED ORDER — ONDANSETRON HCL 4 MG/2ML IJ SOLN
4.0000 mg | Freq: Four times a day (QID) | INTRAMUSCULAR | Status: DC | PRN
  Filled 2022-11-24: qty 2

## 2022-11-24 MED ORDER — TRAMADOL HCL 50 MG PO TABS
50.0000 mg | ORAL_TABLET | Freq: Four times a day (QID) | ORAL | Status: DC | PRN
  Filled 2022-11-24: qty 1

## 2022-11-24 MED ORDER — HYDROMORPHONE HCL 1 MG/ML IJ SOLN
0.2500 mg | INTRAMUSCULAR | Status: DC | PRN
  Administered 2022-11-24 (×3): 0.5 mg via INTRAVENOUS

## 2022-11-24 MED ORDER — PHENYLEPHRINE HCL (PRESSORS) 10 MG/ML IV SOLN
INTRAVENOUS | Status: DC | PRN
  Administered 2022-11-24: 40 ug via INTRAVENOUS

## 2022-11-24 MED ORDER — ONDANSETRON HCL 4 MG/2ML IJ SOLN
INTRAMUSCULAR | Status: AC
  Filled 2022-11-24: qty 2

## 2022-11-24 MED ORDER — PANTOPRAZOLE SODIUM 40 MG PO TBEC
40.0000 mg | DELAYED_RELEASE_TABLET | Freq: Every day | ORAL | Status: DC
  Filled 2022-11-24: qty 1

## 2022-11-24 MED ORDER — METHOCARBAMOL 1000 MG/10ML IJ SOLN: 500.0000 mg | Freq: Three times a day (TID) | INTRAVENOUS | Status: DC | PRN

## 2022-11-24 MED ORDER — GLYCOPYRROLATE PF 0.2 MG/ML IJ SOSY
PREFILLED_SYRINGE | INTRAMUSCULAR | Status: AC
  Filled 2022-11-24: qty 1

## 2022-11-24 MED ORDER — ACETAMINOPHEN 500 MG PO TABS
ORAL_TABLET | ORAL | Status: AC
  Filled 2022-11-24: qty 2

## 2022-11-24 MED ORDER — GLYCOPYRROLATE 0.2 MG/ML IJ SOLN
INTRAMUSCULAR | Status: DC | PRN
  Administered 2022-11-24: .1 mg via INTRAVENOUS

## 2022-11-24 MED ORDER — PROPOFOL 10 MG/ML IV BOLUS
INTRAVENOUS | Status: AC
  Filled 2022-11-24: qty 20

## 2022-11-24 MED ORDER — PROMETHAZINE HCL 25 MG/ML IJ SOLN: 6.2500 mg | INTRAMUSCULAR | Status: DC | PRN

## 2022-11-24 MED ORDER — ACETAMINOPHEN 500 MG PO TABS
1000.0000 mg | ORAL_TABLET | ORAL | Status: AC
  Administered 2022-11-24: 1000 mg via ORAL

## 2022-11-24 MED ORDER — CHLORHEXIDINE GLUCONATE CLOTH 2 % EX PADS: 6.0000 | MEDICATED_PAD | Freq: Once | CUTANEOUS | Status: DC

## 2022-11-24 MED ORDER — TRAMADOL HCL 50 MG PO TABS
50.0000 mg | ORAL_TABLET | Freq: Four times a day (QID) | ORAL | Status: DC | PRN
  Administered 2022-11-24: 50 mg via ORAL

## 2022-11-24 MED ORDER — FENTANYL CITRATE (PF) 100 MCG/2ML IJ SOLN
INTRAMUSCULAR | Status: DC | PRN
  Administered 2022-11-24 (×4): 25 ug via INTRAVENOUS

## 2022-11-24 MED ORDER — CEFAZOLIN SODIUM-DEXTROSE 2-4 GM/100ML-% IV SOLN
2.0000 g | INTRAVENOUS | Status: AC
  Administered 2022-11-24: 2 g via INTRAVENOUS

## 2022-11-24 MED ORDER — DEXAMETHASONE SODIUM PHOSPHATE 4 MG/ML IJ SOLN
INTRAMUSCULAR | Status: DC | PRN
  Administered 2022-11-24: 8 mg via INTRAVENOUS

## 2022-11-24 MED ORDER — HYDROMORPHONE HCL 1 MG/ML IJ SOLN: 0.5000 mg | INTRAMUSCULAR | Status: DC | PRN

## 2022-11-24 MED ORDER — LACTATED RINGERS IV SOLN: INTRAVENOUS | Status: DC

## 2022-11-24 MED ORDER — ROPIVACAINE HCL 5 MG/ML IJ SOLN
INTRAMUSCULAR | Status: DC | PRN
  Administered 2022-11-24: 30 mL via PERINEURAL

## 2022-11-24 MED ORDER — HYDROMORPHONE HCL 1 MG/ML IJ SOLN
INTRAMUSCULAR | Status: AC
  Filled 2022-11-24: qty 0.5

## 2022-11-24 MED ORDER — MIDAZOLAM HCL 2 MG/2ML IJ SOLN
2.0000 mg | Freq: Once | INTRAMUSCULAR | Status: AC
  Administered 2022-11-24: 2 mg via INTRAVENOUS

## 2022-11-24 MED ORDER — AMISULPRIDE (ANTIEMETIC) 5 MG/2ML IV SOLN: 10.0000 mg | Freq: Once | INTRAVENOUS | Status: DC | PRN

## 2022-11-24 MED ORDER — TRANEXAMIC ACID 1000 MG/10ML IV SOLN
INTRAVENOUS | Status: AC
  Filled 2022-11-24: qty 10

## 2022-11-24 MED ORDER — LIDOCAINE HCL (CARDIAC) PF 100 MG/5ML IV SOSY
PREFILLED_SYRINGE | INTRAVENOUS | Status: DC | PRN
  Administered 2022-11-24: 50 mg via INTRAVENOUS

## 2022-11-24 MED ORDER — SODIUM CHLORIDE 0.9 % IV SOLN: INTRAVENOUS | Status: DC

## 2022-11-24 MED ORDER — MEPERIDINE HCL 25 MG/ML IJ SOLN: 6.2500 mg | INTRAMUSCULAR | Status: DC | PRN

## 2022-11-24 MED ORDER — TRANEXAMIC ACID 1000 MG/10ML IV SOLN
INTRAVENOUS | Status: DC | PRN
  Administered 2022-11-24: 3000 mg via TOPICAL

## 2022-11-24 MED ORDER — FENTANYL CITRATE (PF) 100 MCG/2ML IJ SOLN
100.0000 ug | Freq: Once | INTRAMUSCULAR | Status: AC
  Administered 2022-11-24: 100 ug via INTRAVENOUS

## 2022-11-24 MED ORDER — METHOCARBAMOL 500 MG PO TABS
500.0000 mg | ORAL_TABLET | Freq: Three times a day (TID) | ORAL | Status: DC | PRN
  Administered 2022-11-24: 500 mg via ORAL
  Filled 2022-11-24: qty 1

## 2022-11-24 SURGICAL SUPPLY — 62 items
ADH SKN CLS APL DERMABOND .7 (GAUZE/BANDAGES/DRESSINGS) ×4
APL PRP STRL LF DISP 70% ISPRP (MISCELLANEOUS) ×2
APPLIER CLIP 11 MED OPEN (CLIP)
APPLIER CLIP 9.375 MED OPEN (MISCELLANEOUS) ×1
APR CLP MED 11 20 MLT OPN (CLIP)
APR CLP MED 9.3 20 MLT OPN (MISCELLANEOUS) ×1
BINDER BREAST LRG (GAUZE/BANDAGES/DRESSINGS) IMPLANT
BINDER BREAST MEDIUM (GAUZE/BANDAGES/DRESSINGS) IMPLANT
BINDER BREAST XLRG (GAUZE/BANDAGES/DRESSINGS) IMPLANT
BINDER BREAST XXLRG (GAUZE/BANDAGES/DRESSINGS) IMPLANT
BIOPATCH RED 1 DISK 7.0 (GAUZE/BANDAGES/DRESSINGS) IMPLANT
BLADE CLIPPER SURG (BLADE) IMPLANT
BLADE SURG 10 STRL SS (BLADE) ×2 IMPLANT
BLADE SURG 15 STRL LF DISP TIS (BLADE) ×2 IMPLANT
BLADE SURG 15 STRL SS (BLADE) ×1
CANISTER SUCT 1200ML W/VALVE (MISCELLANEOUS) ×2 IMPLANT
CHLORAPREP W/TINT 26 (MISCELLANEOUS) ×4 IMPLANT
CLIP APPLIE 11 MED OPEN (CLIP) IMPLANT
CLIP APPLIE 9.375 MED OPEN (MISCELLANEOUS) IMPLANT
CLIP TI WIDE RED SMALL 6 (CLIP) IMPLANT
COVER BACK TABLE 60X90IN (DRAPES) ×2 IMPLANT
COVER MAYO STAND STRL (DRAPES) ×2 IMPLANT
DERMABOND ADVANCED .7 DNX12 (GAUZE/BANDAGES/DRESSINGS) IMPLANT
DRAIN CHANNEL 19F RND (DRAIN) ×4 IMPLANT
DRAPE LAPAROSCOPIC ABDOMINAL (DRAPES) IMPLANT
DRAPE U-SHAPE 76X120 STRL (DRAPES) IMPLANT
DRAPE UTILITY XL STRL (DRAPES) ×2 IMPLANT
ELECT BLADE 4.0 EZ CLEAN MEGAD (MISCELLANEOUS)
ELECT REM PT RETURN 9FT ADLT (ELECTROSURGICAL) ×1
ELECTRODE BLDE 4.0 EZ CLN MEGD (MISCELLANEOUS) IMPLANT
ELECTRODE REM PT RTRN 9FT ADLT (ELECTROSURGICAL) ×2 IMPLANT
EVACUATOR SILICONE 100CC (DRAIN) ×4 IMPLANT
GAUZE PAD ABD 8X10 STRL (GAUZE/BANDAGES/DRESSINGS) ×4 IMPLANT
GAUZE SPONGE 4X4 12PLY STRL (GAUZE/BANDAGES/DRESSINGS) ×2 IMPLANT
GLOVE BIO SURGEON STRL SZ7 (GLOVE) ×2 IMPLANT
GLOVE BIOGEL PI IND STRL 7.5 (GLOVE) ×2 IMPLANT
GOWN STRL REUS W/ TWL LRG LVL3 (GOWN DISPOSABLE) ×6 IMPLANT
GOWN STRL REUS W/TWL LRG LVL3 (GOWN DISPOSABLE) ×3
NDL HYPO 25X1 1.5 SAFETY (NEEDLE) IMPLANT
NEEDLE HYPO 25X1 1.5 SAFETY (NEEDLE) IMPLANT
NS IRRIG 1000ML POUR BTL (IV SOLUTION) ×2 IMPLANT
PACK BASIN DAY SURGERY FS (CUSTOM PROCEDURE TRAY) ×2 IMPLANT
PENCIL SMOKE EVACUATOR (MISCELLANEOUS) ×2 IMPLANT
PIN SAFETY STERILE (MISCELLANEOUS) ×2 IMPLANT
RETRACTOR ONETRAX LX 90X20 (MISCELLANEOUS) IMPLANT
SLEEVE SCD COMPRESS KNEE MED (STOCKING) ×2 IMPLANT
SPIKE FLUID TRANSFER (MISCELLANEOUS) IMPLANT
SPONGE T-LAP 18X18 ~~LOC~~+RFID (SPONGE) ×4 IMPLANT
SPONGE T-LAP 4X18 ~~LOC~~+RFID (SPONGE) IMPLANT
STRIP CLOSURE SKIN 1/2X4 (GAUZE/BANDAGES/DRESSINGS) IMPLANT
SUT ETHILON 2 0 FS 18 (SUTURE) ×2 IMPLANT
SUT ETHILON 3 0 PS 1 (SUTURE) IMPLANT
SUT MNCRL AB 4-0 PS2 18 (SUTURE) IMPLANT
SUT MON AB 5-0 PS2 18 (SUTURE) IMPLANT
SUT SILK 2 0 SH (SUTURE) ×2 IMPLANT
SUT VIC AB 3-0 SH 27 (SUTURE)
SUT VIC AB 3-0 SH 27X BRD (SUTURE) IMPLANT
SUT VICRYL 3-0 CR8 SH (SUTURE) ×2 IMPLANT
SYR CONTROL 10ML LL (SYRINGE) IMPLANT
TOWEL GREEN STERILE FF (TOWEL DISPOSABLE) ×2 IMPLANT
TUBE CONNECTING 20X1/4 (TUBING) ×2 IMPLANT
YANKAUER SUCT BULB TIP NO VENT (SUCTIONS) ×2 IMPLANT

## 2022-11-24 NOTE — Progress Notes (Signed)
Assisted Dr. Sabra Heck with left, right, pectoralis, ultrasound guided block. Side rails up, monitors on throughout procedure. See vital signs in flow sheet. Tolerated Procedure well.

## 2022-11-24 NOTE — Anesthesia Procedure Notes (Signed)
Anesthesia Regional Block: Pectoralis block   Pre-Anesthetic Checklist: , timeout performed,  Correct Patient, Correct Site, Correct Laterality,  Correct Procedure, Correct Position, site marked,  Risks and benefits discussed,  Surgical consent,  Pre-op evaluation,  At surgeon's request and post-op pain management  Laterality: Left and Right  Prep: chloraprep       Needles:  Injection technique: Single-shot  Needle Type: Stimiplex     Needle Length: 9cm  Needle Gauge: 21     Additional Needles:   Procedures:,,,, ultrasound used (permanent image in chart),,    Narrative:  Start time: 11/24/2022 8:25 AM End time: 11/24/2022 8:30 AM Injection made incrementally with aspirations every 5 mL.  Performed by: Personally  Anesthesiologist: Lynda Rainwater, MD

## 2022-11-24 NOTE — Anesthesia Postprocedure Evaluation (Signed)
Anesthesia Post Note  Patient: Whitney Jenkins  Procedure(s) Performed: BILATERAL TOTAL MASTECTOMY (Bilateral: Breast)     Patient location during evaluation: PACU Anesthesia Type: General Level of consciousness: awake and alert Pain management: pain level controlled Vital Signs Assessment: post-procedure vital signs reviewed and stable Respiratory status: spontaneous breathing, nonlabored ventilation and respiratory function stable Cardiovascular status: blood pressure returned to baseline and stable Postop Assessment: no apparent nausea or vomiting Anesthetic complications: no   No notable events documented.  Last Vitals:  Vitals:   11/24/22 1130 11/24/22 1200  BP: 123/82 124/67  Pulse: 96 74  Resp: 17 16  Temp:  36.7 C  SpO2: 97% 97%    Last Pain:  Vitals:   11/24/22 1200  TempSrc:   PainSc: 2                  Lynda Rainwater

## 2022-11-24 NOTE — Anesthesia Preprocedure Evaluation (Signed)
Anesthesia Evaluation  Patient identified by MRN, date of birth, ID band Patient awake    Reviewed: Allergy & Precautions, NPO status , Patient's Chart, lab work & pertinent test results  Airway Mallampati: II  TM Distance: >3 FB Neck ROM: Full    Dental no notable dental hx.    Pulmonary neg pulmonary ROS, Patient abstained from smoking., former smoker   Pulmonary exam normal breath sounds clear to auscultation       Cardiovascular negative cardio ROS Normal cardiovascular exam Rhythm:Regular Rate:Normal     Neuro/Psych negative neurological ROS  negative psych ROS   GI/Hepatic Neg liver ROS,GERD  Medicated,,  Endo/Other  negative endocrine ROS    Renal/GU negative Renal ROS  negative genitourinary   Musculoskeletal  (+) Arthritis , Osteoarthritis,    Abdominal   Peds negative pediatric ROS (+)  Hematology negative hematology ROS (+)   Anesthesia Other Findings   Reproductive/Obstetrics negative OB ROS                             Anesthesia Physical Anesthesia Plan  ASA: II  Anesthesia Plan: General   Post-op Pain Management: Regional block* and Tylenol PO (pre-op)*   Induction: Intravenous  PONV Risk Score and Plan: 3 and Ondansetron, Dexamethasone, Treatment may vary due to age or medical condition and Midazolam  Airway Management Planned: LMA  Additional Equipment:   Intra-op Plan:   Post-operative Plan: Extubation in OR  Informed Consent: I have reviewed the patients History and Physical, chart, labs and discussed the procedure including the risks, benefits and alternatives for the proposed anesthesia with the patient or authorized representative who has indicated his/her understanding and acceptance.     Dental advisory given  Plan Discussed with: CRNA and Surgeon  Anesthesia Plan Comments:         Anesthesia Quick Evaluation

## 2022-11-24 NOTE — Interval H&P Note (Signed)
History and Physical Interval Note:  11/24/2022 7:03 AM  Whitney Jenkins  has presented today for surgery, with the diagnosis of BRCA 2.  The various methods of treatment have been discussed with the patient and family. After consideration of risks, benefits and other options for treatment, the patient has consented to  Procedure(s): BILATERAL TOTAL MASTECTOMY (Bilateral) as a surgical intervention.  The patient's history has been reviewed, patient examined, no change in status, stable for surgery.  I have reviewed the patient's chart and labs.  Questions were answered to the patient's satisfaction.     Rolm Bookbinder

## 2022-11-24 NOTE — Op Note (Signed)
Preoperative diagnosis: BRCA2 mutation Postoperative diagnosis: Same as above Procedure: 1.  Right risk reducing mastectomy 2.  Left risk reducing mastectomy Surgeon: Dr. Serita Grammes : Anesthesia: General with bilateral pec blocks Estimated blood loss: Minimal Complications: None Drains: None Specimens: 1.  Right breast tissue marked short superior, long lateral 2.  Left breast tissue marked short superior, long lateral Sponge needle count was correct completion Disposition recovery stable condition    Indications: 22 yof lives in [place] and has insurance (office) job presented with BRCA 2 mutation.  She has family history of breast cancer in her mom and sister with mutation that led her to testing. The mutation is [id number].  She has undergone risk reducing SO with Dr Lynnette Caffey.already.  She has no complaints referable to either breast.  She has an MRI in April of 2022 that is negative with d density breasts.  She has mm at [facility] in January she reports as negative. She did have an MRI in 1/24 that shows linear nme that was biopsy negative.  She desired to proceed with bilateral risk reducing mastectomies without reconstruction     Procedure: After informed consent was obtained she first underwent bilateral pec blocks.  She was given antibiotics.  SCDs were placed.  She was then taken to the operating room placed under general anesthesia without complication.  She was prepped and draped in the standard sterile surgical fashion.  A surgical timeout was then performed.  I did the left mastectomy first.  I made similar incisions on both sides.  The bottom limb encompassed the inframammary fold and I remove the nipple areola and all the skin in between.  I made elliptical incision that I took to the anterior axillary line.  Flaps were then created to the clavicle, parasternal area, inframammary fold, and anterior axillary line.  I obliterated the inframammary fold and remove the fat off of the  sternum.  I then remove the breast and the fascia from the pectoralis muscle.  I marked this as above.  I then placed a TXA soaked sponge for 5 minutes.  A 19 [place] Blake drain was placed and secured with a 2-0 nylon suture.  I then closed this with 3-0 Vicryl and 4-0 Monocryl.  A single 3-0 nylon was placed in the middle externally.  Glue and Steri-Strips were eventually applied.  I also did the right mastectomy in a similar fashion.  The elliptical incision was made and I created my flaps.  I remove the breast tissue just as above.  I also placed a TXA soaked sponge for 5 minutes.  A 19 [place] Blake drain was then placed and secured with a 2-0 nylon suture.  I closed this with a 3-0 Vicryl and a 4-0 Monocryl.  Single 3-0 nylon was placed again in the middle externally.  Glue and Steri-Strips were applied.  She tolerated this well was extubated and transferred to recovery stable.

## 2022-11-24 NOTE — Transfer of Care (Signed)
Immediate Anesthesia Transfer of Care Note  Patient: Whitney Jenkins  Procedure(s) Performed: BILATERAL TOTAL MASTECTOMY (Bilateral: Breast)  Patient Location: PACU  Anesthesia Type:General  Level of Consciousness: awake, alert , and oriented  Airway & Oxygen Therapy: Patient Spontanous Breathing and Patient connected to nasal cannula oxygen  Post-op Assessment: Report given to RN and Post -op Vital signs reviewed and stable  Post vital signs: Reviewed and stable  Last Vitals:  Vitals Value Taken Time  BP 131/77 11/24/22 1040  Temp    Pulse 92 11/24/22 1040  Resp 18 11/24/22 1040  SpO2 98 % 11/24/22 1040  Vitals shown include unvalidated device data.  Last Pain:  Vitals:   11/24/22 0656  TempSrc: Oral  PainSc: 0-No pain         Complications: No notable events documented.

## 2022-11-24 NOTE — Discharge Instructions (Signed)
[facility] [place] [phone]  MASTECTOMY: POST OP INSTRUCTIONS Take 400 mg of ibuprofen every 8 hours or 650 mg tylenol every 6 hours for next 72 hours then as needed. Use ice several times daily also. Always review your discharge instruction sheet given to you by the facility where your surgery was performed.  A prescription for pain medication may be given to you upon discharge.  Take your pain medication as prescribed, if needed.  If narcotic pain medicine is not needed, then you may take acetaminophen (Tylenol), naprosyn (Alleve) or ibuprofen (Advil) as needed. Take your usually prescribed medications unless otherwise directed. If you need a refill on your pain medication, please contact your pharmacy.  They will contact our office to request authorization.  Prescriptions will not be filled after 5pm or on week-ends. You should follow a light diet the first 24 hours after surgery.  Resume your normal diet the day after surgery. Most patients will experience some swelling and bruising on the chest and underarm.  Ice packs will help.  Swelling and bruising can take several days to resolve. Wear the binder or [place] bra for 72 hours day and night. After night please wear during the day.  It is common to experience some constipation if taking pain medication after surgery.  Increasing fluid intake and taking a stool softener (such as Colace) will usually help or prevent this problem from occurring.  A mild laxative (Milk of Magnesia or Miralax) should be taken according to package instructions if there are no bowel movements after 48 hours. There is glue and steristrips on your incision. They will come off in the next few weeks.  You may take a shower 48 hours after surgery.  Any sutures will be removed at an office visit DRAINS:  If you have drains in place, it is important to keep a list of the amount of drainage produced each day in your drains.  Before leaving the hospital, you  should be instructed on drain care.  Call our office if you have any questions about your drains. I will remove your drains when they put out less than 30 cc or ml for 2 consecutive days. ACTIVITIES:  You may resume regular (light) daily activities beginning the next day--such as daily self-care, walking, climbing stairs--gradually increasing activities as tolerated.  You may have sexual intercourse when it is comfortable.  Refrain from any heavy lifting or straining until approved by your doctor. You may drive when you are no longer taking prescription pain medication, you can comfortably wear a seatbelt, and you can safely maneuver your car and apply brakes. RETURN TO WORK:  __________________________________________________________ You should see your doctor in the office for a follow-up appointment approximately 3-5 days after your surgery.  Your doctor's nurse will typically make your follow-up appointment when she calls you with your pathology report.  Expect your pathology report 3-4business days after surgery. OTHER INSTRUCTIONS: ______________________________________________________________________________________________ ____________________________________________________________________________________________ WHEN TO CALL YOUR DR Magan Winnett: Fever over 101.0 Nausea and/or vomiting Extreme swelling or bruising Continued bleeding from incision. Increased pain, redness, or drainage from the incision. The clinic staff is available to answer your questions during regular business hours.  Please don't hesitate to call and ask to speak to one of the nurses for clinical concerns.  If you have a medical emergency, go to the nearest emergency room or call 911.  A surgeon from [facility] is always on call at the hospital. [address], [address], [place], [place]    [address] ? [address] [address], [place], [place]   [address] [phone] ? [phone] ? FAX [phone] Web site:  [email or web address] About my Jackson-Pratt Bulb Drain  What is a Jackson-Pratt bulb? A Jackson-Pratt is a soft, round device used to collect drainage. It is connected to a long, thin drainage catheter, which is held in place by one or two small stiches near your surgical incision site. When the bulb is squeezed, it forms a vacuum, forcing the drainage to empty into the bulb.  Emptying the Jackson-Pratt bulb- To empty the bulb: 1. Release the plug on the top of the bulb. 2. Pour the bulb's contents into a measuring container which your nurse will provide. 3. Record the time emptied and amount of drainage. Empty the drain(s) as often as your     doctor or nurse recommends.  Date                  Time                    Amount (Drain 1)                 Amount (Drain 2)  _____________________________________________________________________  _____________________________________________________________________  _____________________________________________________________________  _____________________________________________________________________  _____________________________________________________________________  _____________________________________________________________________  _____________________________________________________________________  _____________________________________________________________________  Squeezing the Jackson-Pratt Bulb- To squeeze the bulb: 1. Make sure the plug at the top of the bulb is open. 2. Squeeze the bulb tightly in your fist. You will hear air squeezing from the bulb. 3. Replace the plug while the bulb is squeezed. 4. Use a safety pin to attach the bulb to your clothing. This will keep the catheter from     pulling at the bulb insertion site.  When to call your doctor- Call your doctor if: Drain site becomes red, swollen or hot. You have a fever greater than 101 degrees F. There is oozing at the drain site. Drain  falls out (apply a guaze bandage over the drain hole and secure it with tape). Drainage increases daily not related to activity patterns. (You will usually have more drainage when you are active than when you are resting.) Drainage has a bad odor.   

## 2022-11-24 NOTE — Anesthesia Procedure Notes (Addendum)
Procedure Name: LMA Insertion Date/Time: 11/24/2022 8:40 AM  Performed by: Bufford Spikes, CRNAPre-anesthesia Checklist: Patient identified, Emergency Drugs available, Suction available and Patient being monitored Patient Re-evaluated:Patient Re-evaluated prior to induction Oxygen Delivery Method: Circle system utilized Preoxygenation: Pre-oxygenation with 100% oxygen Induction Type: IV induction Ventilation: Mask ventilation without difficulty LMA: LMA inserted LMA Size: 4.0 Number of attempts: 1 Airway Equipment and Method: Bite block Placement Confirmation: positive ETCO2 Tube secured with: Tape Dental Injury: Teeth and Oropharynx as per pre-operative assessment

## 2022-11-25 ENCOUNTER — Encounter (HOSPITAL_BASED_OUTPATIENT_CLINIC_OR_DEPARTMENT_OTHER): Payer: Self-pay | Admitting: General Surgery

## 2022-11-25 MED ORDER — METHOCARBAMOL 750 MG PO TABS: 750.0000 mg | ORAL_TABLET | Freq: Four times a day (QID) | ORAL | 0 refills | Status: AC | PRN

## 2022-11-25 MED ORDER — OXYCODONE HCL 5 MG PO TABS: 5.0000 mg | ORAL_TABLET | Freq: Four times a day (QID) | ORAL | 0 refills | Status: AC | PRN

## 2022-11-25 NOTE — Discharge Summary (Signed)
Physician Discharge Summary  Patient ID: JISSELLE KRIEGEL MRN: [id number] DOB/AGE: [birth year] 47 y.o.  Admit date: 11/24/2022 Discharge date: 11/25/2022  Admission Diagnoses:  Discharge Diagnoses:  Principal Problem:   S/P bilateral mastectomy   Discharged Condition: good  Hospital Course: 6 yof with brca mutation, underwent bilateral rrm doing well following am  Consults: None  Significant Diagnostic Studies: none  Treatments: surgery: bilateral rrm  Discharge Exam: Blood pressure 107/63, pulse 67, temperature 98.9 F (37.2 C), resp. rate 18, height '5\' 9"'$  (1.753 m), weight 81.1 kg, last menstrual period 09/26/2018, SpO2 97 %. General nad Cv regular Flaps viable, soft no hematoma, drains as expected  Disposition: Discharge disposition: 01-Home or Self Care        Allergies as of 11/25/2022       Reactions   Nsaids    Other reaction(s): Other Ulcerative colitis   Oxycodone-acetaminophen Nausea And Vomiting, Nausea Only   Other reaction(s): GI Upset (intolerance), Unknown   Sulfasalazine Other (See Comments)   Headaches and nausea Headaches and nausea Headaches and nausea Headaches and nausea   Percocet [oxycodone-acetaminophen] Nausea And Vomiting        Medication List     TAKE these medications    B-12 PO Take by mouth.   CENTRUM PO Take by mouth.   D3 ADULT PO Take by mouth.   estradiol 0.1 mg/24hr patch Commonly known as: CLIMARA - Dosed in mg/24 hr estradiol 0.1 mg/24 hr weekly transdermal patch  APPLY 1 PATCH BY TRANSDERMAL ROUTE EVERY WEEK   iron polysaccharides 150 MG capsule Commonly known as: NIFEREX Take 150 mg by mouth daily.   mesalamine 1000 MG suppository Commonly known as: CANASA Place 1,000 mg rectally every other day.   methocarbamol 750 MG tablet Commonly known as: ROBAXIN Take 1 tablet (750 mg total) by mouth 4 (four) times daily as needed (use for muscle cramps/pain).   omeprazole 40 MG capsule Commonly known  as: PRILOSEC Take 40 mg by mouth daily.   oxyCODONE 5 MG immediate release tablet Commonly known as: Oxy IR/ROXICODONE Take 1 tablet (5 mg total) by mouth every 6 (six) hours as needed.        Follow-up Information     Rolm Bookbinder, MD Follow up in 2 week(s).   Specialty: General Surgery Contact information: [address] [place] [address] [phone]                 Signed: Rolm Bookbinder 11/25/2022, 9:54 AM

## 2022-11-26 LAB — SURGICAL PATHOLOGY

## 2022-12-10 NOTE — Therapy (Signed)
OUTPATIENT PHYSICAL THERAPY ONCOLOGY EVALUATION  Patient Name: Whitney Jenkins MRN: [id number] DOB:[birth year], 47 y.o., female Today's Date: 12/11/2022  END OF SESSION:  PT End of Session - 12/11/22 1158     Visit Number 1    Number of Visits 12    Date for PT Re-Evaluation 01/22/23    PT Start Time 1105    PT Stop Time [id number]    PT Time Calculation (min) 51 min    Activity Tolerance Patient tolerated treatment well    Behavior During Therapy WFL for tasks assessed/performed             Past Medical History:  Diagnosis Date   Anemia    was on iron pills but states she has been off for a while   Chronic chest wall pain    Family history of breast cancer    Family history of breast cancer in female    Family history of pancreatic cancer    FH: BRCA2 gene positive    GERD (gastroesophageal reflux disease)    takes Omeprazole daily   History of migraine    last one 2wks ago   Leukopenia 12/11/2017   Ulcerative colitis ([place])    Followed by Dr. Collene Mares   Vitamin B12 deficiency 12/11/2017   Past Surgical History:  Procedure Laterality Date   CHOLECYSTECTOMY N/A 02/22/2014   Procedure: LAPAROSCOPIC CHOLECYSTECTOMY;  Surgeon: Harl Bowie, MD;  Location: [place];  Service: General;  Laterality: N/A;   KNEE ARTHROSCOPY Left    LAPAROSCOPIC BILATERAL SALPINGO OOPHERECTOMY Bilateral 10/20/2018   Procedure: LAPAROSCOPIC BILATERAL SALPINGO OOPHORECTOMY;  Surgeon: Linda Hedges, DO;  Location: [place];  Service: Gynecology;  Laterality: Bilateral;   MYOMECTOMY     TOTAL MASTECTOMY Bilateral 11/24/2022   Procedure: BILATERAL TOTAL MASTECTOMY;  Surgeon: Rolm Bookbinder, MD;  Location: [place];  Service: General;  Laterality: Bilateral;   Patient Active Problem List   Diagnosis Date Noted   S/P bilateral mastectomy 11/24/2022   Costochondritis 08/31/2020   Bilateral shoulder pain 08/31/2020   Low back pain 08/31/2020   High risk medication use  08/31/2020   Sacroiliitis, not elsewhere classified ([place]) 08/31/2020   Chronic chest wall pain 07/04/2020   History of bilateral oophorectomies 07/04/2020   Low TSH level 03/21/2020   Family history of thyroid disease in sister 03/21/2020   Leukopenia 12/11/2017   Vitamin B12 deficiency 12/11/2017   Ulcerative colitis, unspecified, without complications ([place]) [id number]   Uterine fibroid 11/06/2015   Genetic testing 02/22/2015   BRCA2 positive 02/22/2015   FH: BRCA2 gene positive    Family history of breast cancer    Family history of breast cancer in female    Family history of pancreatic cancer    Chronic cholecystitis 02/17/2014      REFERRING PROVIDER: Rolm Bookbinder, MD  REFERRING DIAG: Bilateral Risk reducing mastectomies  THERAPY DIAG:  Genetic susceptibility to malignant neoplasm of breast  BRCA2 positive  Abnormal posture  ONSET DATE: 11/22/2022  Rationale for Evaluation and Treatment: Rehabilitation  SUBJECTIVE:  SUBJECTIVE STATEMENT I am anxious to return to my normal. I started doing the exercises that he gave me on Tuesday. ROM on the left is not as good as on the right.  PERTINENT HISTORY:  Pt has family history of breast Cancer in her mom and sister with mutation that led her to do testing. She has had Salpingo-oophorectomy already. She is now s/p Bilateral  risk reducing Mastectomies on 11/24/2022 and does not wish to do reconstruction. Drains were removed on March 7,2024. She was given some exercises by MD.  PAIN:  Are you having pain? Nomore soreness than pain, Left greater than right chest,   PRECAUTIONS: None  WEIGHT BEARING RESTRICTIONS: No  FALLS:  Has patient fallen in last 6 months? No  LIVING ENVIRONMENT: Lives with: lives with their spouse Lives in:  House/apartment Stairs: Yes; External: 1 flight steps; on right going up Has following equipment at home: None  OCCUPATION: Medical illustrator  LEISURE: basketball, walk, watch TV,  HAND DOMINANCE: right   PRIOR LEVEL OF FUNCTION: Independent  PATIENT GOALS: Get back ROM/strength   OBJECTIVE:  COGNITION: Overall cognitive status: Within functional limits for tasks assessed   PALPATION: Tender globally bilateral chest and rib cage  OBSERVATIONS / OTHER ASSESSMENTS: Mastectomy incisions covered with steri strips, mild swelling left greater than righ axillary regions/lateral trunk  SENSATION: Light touch: Deficits     POSTURE: forward head, round shoulders  UPPER EXTREMITY AROM/PROM:  A/PROM RIGHT   eval   Shoulder extension 55  Shoulder flexion 127, tight at ribs from drains  Shoulder abduction 110  ribs  Shoulder internal rotation 62  Shoulder external rotation 84    (Blank rows = not tested)  A/PROM LEFT   eval  Shoulder extension 42  Shoulder flexion 113  Shoulder abduction 80  Shoulder internal rotation   Shoulder external rotation     (Blank rows = not tested)  CERVICAL AROM: All within functional limits:      UPPER EXTREMITY STRENGTH: NT due to sx   LYMPHEDEMA ASSESSMENTS:   SURGERY TYPE/DATE: 11/24/2022, Bilateral risk reducing mastectomies for BRCA2 positive gene  NUMBER OF LYMPH NODES REMOVED: 2 breast LN's ?  CHEMOTHERAPY: No  RADIATION:NO  HORMONE TREATMENT: NO  INFECTIONS: NO  LYMPHEDEMA ASSESSMENTS:   LANDMARK RIGHT  eval  10 cm proximal to olecranon process   Olecranon process   10 cm proximal to ulnar styloid process   Just proximal to ulnar styloid process   Across hand at thumb web space   At base of 2nd digit   (Blank rows = not tested)  LANDMARK LEFT  eval  10 cm proximal to olecranon process   Olecranon process   10 cm proximal to ulnar styloid process   Just proximal to ulnar styloid process   Across hand at  thumb web space   At base of 2nd digit   (Blank rows = not tested)      QUICK DASH SURVEY: 30%   TODAY'S TREATMENT:  DATE: Reviewed 4 post op exercises and performed flexion and scaption with wand, supine star gazer, wall slide and scapular retraction x 5 ea. Will substitute wand in place of claped hands flexion.  PATIENT EDUCATION:  Education details: supine wand flexion and scaption, reviewed 4 post op exercises x 4-5 reps ea Person educated: Patient Education method: Consulting civil engineer, Demonstration, and Handouts Education comprehension: verbalized understanding and returned demonstration  HOME EXERCISE PROGRAM: Supine wand flex and scaption, post op exercises  ASSESSMENT:  CLINICAL IMPRESSION: Patient is a 47 y.o. female who was seen today for physical therapy evaluation and treatment for bilateral risk reducing Mastectomies due to positive BRCA2 gene.  She presents with limitations in shoulder ROM and strength, tenderness to bilateral upper quarters, Quick dash with 30% disability and limitations with home, work and recreational activities. She will benefit from skilled PT to address deficits and return to PLOF.  OBJECTIVE IMPAIRMENTS: decreased activity tolerance, decreased knowledge of condition, decreased mobility, decreased ROM, decreased strength, increased edema, impaired UE functional use, and postural dysfunction.   ACTIVITY LIMITATIONS: carrying, lifting, sleeping, and reach over head  PARTICIPATION LIMITATIONS: cleaning, occupation, and yard work  PERSONAL FACTORS: 1 comorbidity: BRCA2+  are also affecting patient's functional outcome.   REHAB POTENTIAL: Excellent  CLINICAL DECISION MAKING: Stable/uncomplicated  EVALUATION COMPLEXITY: Low  GOALS: Goals reviewed with patient? Yes  SHORT TERM GOALS= LONG TERM GOALS: Target date:  01/22/2023  Pt will be independent in a HEP for ROM and strengthening Baseline: Goal status: INITIAL  2.  Pt will have bilateral shoulder flexion and abduction atleast 150 degrees for improved reahcing Baseline:  Goal status: INITIAL  3.  Pts quick dash will be no greater than 10% Baseline:  Goal status: INITIAL  4.  Pt will resume normal light to medium household tasks without complaint Baseline:  Goal status: INITIAL  5.  Pt will have improved sleep Baseline:  Goal status: INITIAL  PLAN:  PT FREQUENCY: 2x/week  PT DURATION: 6 weeks  PLANNED INTERVENTIONS: Therapeutic exercises, Therapeutic activity, Neuromuscular re-education, Patient/Family education, Self Care, Joint mobilization, scar mobilization, Manual therapy, and Re-evaluation  PLAN FOR NEXT SESSION: STM to bilateral upper quarter, AAROM pulleys, PROM , assess incisions and start scar massage when ready, MFR techniques to rib area/chest/axilla   Claris Pong, PT 12/11/2022, 11:59 AM

## 2022-12-11 ENCOUNTER — Ambulatory Visit: Payer: No Typology Code available for payment source | Attending: General Surgery

## 2022-12-11 ENCOUNTER — Other Ambulatory Visit: Payer: Self-pay

## 2022-12-11 DIAGNOSIS — R293 Abnormal posture: Secondary | ICD-10-CM | POA: Diagnosis present

## 2022-12-11 DIAGNOSIS — Z9013 Acquired absence of bilateral breasts and nipples: Secondary | ICD-10-CM | POA: Diagnosis not present

## 2022-12-11 DIAGNOSIS — Z1509 Genetic susceptibility to other malignant neoplasm: Secondary | ICD-10-CM | POA: Insufficient documentation

## 2022-12-11 DIAGNOSIS — Z1501 Genetic susceptibility to malignant neoplasm of breast: Secondary | ICD-10-CM | POA: Diagnosis not present

## 2022-12-11 NOTE — Patient Instructions (Signed)
SHOULDER: Flexion - Supine (Cane)        Cancer Rehab [phone]    Hold cane in both hands. Raise arms up overhead. Do not allow back to arch. Hold _5-10__ seconds. Do __5__ times; __2__ times a day.   Hands shoulder width Hands wider than shoulder width.Y position    Copyright  VHI. All rights reserved.

## 2022-12-15 ENCOUNTER — Ambulatory Visit: Payer: No Typology Code available for payment source

## 2022-12-15 DIAGNOSIS — R293 Abnormal posture: Secondary | ICD-10-CM

## 2022-12-15 DIAGNOSIS — Z9013 Acquired absence of bilateral breasts and nipples: Secondary | ICD-10-CM

## 2022-12-15 DIAGNOSIS — Z1501 Genetic susceptibility to malignant neoplasm of breast: Secondary | ICD-10-CM

## 2022-12-15 NOTE — Therapy (Signed)
OUTPATIENT PHYSICAL THERAPY ONCOLOGY TREATMENT  Patient Name: Whitney Jenkins MRN: [id number] DOB:May 21, 1976, 47 y.o., female Today's Date: 12/15/2022  END OF SESSION:  PT End of Session - 12/15/22 1011     Visit Number 2    Number of Visits 12    Date for PT Re-Evaluation 01/22/23    PT Start Time 1008    PT Stop Time 1102    PT Time Calculation (min) 54 min    Activity Tolerance Patient tolerated treatment well    Behavior During Therapy WFL for tasks assessed/performed             Past Medical History:  Diagnosis Date   Anemia    was on iron pills but states she has been off for a while   Chronic chest wall pain    Family history of breast cancer    Family history of breast cancer in female    Family history of pancreatic cancer    FH: BRCA2 gene positive    GERD (gastroesophageal reflux disease)    takes Omeprazole daily   History of migraine    last one 2wks ago   Leukopenia 12/11/2017   Ulcerative colitis ([place])    Followed by Dr. Collene Mares   Vitamin B12 deficiency 12/11/2017   Past Surgical History:  Procedure Laterality Date   CHOLECYSTECTOMY N/A 02/22/2014   Procedure: LAPAROSCOPIC CHOLECYSTECTOMY;  Surgeon: Harl Bowie, MD;  Location: [place];  Service: General;  Laterality: N/A;   KNEE ARTHROSCOPY Left    LAPAROSCOPIC BILATERAL SALPINGO OOPHERECTOMY Bilateral 10/20/2018   Procedure: LAPAROSCOPIC BILATERAL SALPINGO OOPHORECTOMY;  Surgeon: Linda Hedges, DO;  Location: [place];  Service: Gynecology;  Laterality: Bilateral;   MYOMECTOMY     TOTAL MASTECTOMY Bilateral 11/24/2022   Procedure: BILATERAL TOTAL MASTECTOMY;  Surgeon: Rolm Bookbinder, MD;  Location: [place];  Service: General;  Laterality: Bilateral;   Patient Active Problem List   Diagnosis Date Noted   S/P bilateral mastectomy 11/24/2022   Costochondritis 08/31/2020   Bilateral shoulder pain 08/31/2020   Low back pain 08/31/2020   High risk medication use  08/31/2020   Sacroiliitis, not elsewhere classified ([place]) 08/31/2020   Chronic chest wall pain 07/04/2020   History of bilateral oophorectomies 07/04/2020   Low TSH level 03/21/2020   Family history of thyroid disease in sister 03/21/2020   Leukopenia 12/11/2017   Vitamin B12 deficiency 12/11/2017   Ulcerative colitis, unspecified, without complications ([place]) [id number]   Uterine fibroid 11/06/2015   Genetic testing 02/22/2015   BRCA2 positive 02/22/2015   FH: BRCA2 gene positive    Family history of breast cancer    Family history of breast cancer in female    Family history of pancreatic cancer    Chronic cholecystitis 02/17/2014      REFERRING PROVIDER: Rolm Bookbinder, MD  REFERRING DIAG: Bilateral Risk reducing mastectomies  THERAPY DIAG:  Genetic susceptibility to malignant neoplasm of breast  BRCA2 positive  Abnormal posture  H/O bilateral mastectomy  ONSET DATE: 11/22/2022  Rationale for Evaluation and Treatment: Rehabilitation  SUBJECTIVE:  SUBJECTIVE STATEMENT I've done the new exercises she gave me last week and those are going well.   PERTINENT HISTORY:  Pt has family history of breast Cancer in her mom and sister with mutation that led her to do testing. She has had Salpingo-oophorectomy already. She is now s/p Bilateral  risk reducing Mastectomies on 11/24/2022 and does not wish to do reconstruction. Drains were removed on March 7,2024. She was given some exercises by MD.  PAIN:  Are you having pain? Nomore soreness than pain, Left greater than right chest,   PRECAUTIONS: None  WEIGHT BEARING RESTRICTIONS: No  FALLS:  Has patient fallen in last 6 months? No  LIVING ENVIRONMENT: Lives with: lives with their spouse Lives in: House/apartment Stairs: Yes; External: 1  flight steps; on right going up Has following equipment at home: None  OCCUPATION: Medical illustrator  LEISURE: basketball, walk, watch TV,  HAND DOMINANCE: right   PRIOR LEVEL OF FUNCTION: Independent  PATIENT GOALS: Get back ROM/strength   OBJECTIVE:  COGNITION: Overall cognitive status: Within functional limits for tasks assessed   PALPATION: Tender globally bilateral chest and rib cage  OBSERVATIONS / OTHER ASSESSMENTS: Mastectomy incisions covered with steri strips, mild swelling left greater than righ axillary regions/lateral trunk  SENSATION: Light touch: Deficits     POSTURE: forward head, round shoulders  UPPER EXTREMITY AROM/PROM:  A/PROM RIGHT   eval   Shoulder extension 55  Shoulder flexion 127, tight at ribs from drains  Shoulder abduction 110  ribs  Shoulder internal rotation 62  Shoulder external rotation 84    (Blank rows = not tested)  A/PROM LEFT   eval  Shoulder extension 42  Shoulder flexion 113  Shoulder abduction 80  Shoulder internal rotation   Shoulder external rotation     (Blank rows = not tested)  CERVICAL AROM: All within functional limits:      UPPER EXTREMITY STRENGTH: NT due to sx   LYMPHEDEMA ASSESSMENTS:   SURGERY TYPE/DATE: 11/24/2022, Bilateral risk reducing mastectomies for BRCA2 positive gene  NUMBER OF LYMPH NODES REMOVED: 2 breast LN's ?  CHEMOTHERAPY: No  RADIATION:NO  HORMONE TREATMENT: NO  INFECTIONS: NO  LYMPHEDEMA ASSESSMENTS:   LANDMARK RIGHT  eval  10 cm proximal to olecranon process   Olecranon process   10 cm proximal to ulnar styloid process   Just proximal to ulnar styloid process   Across hand at thumb web space   At base of 2nd digit   (Blank rows = not tested)  LANDMARK LEFT  eval  10 cm proximal to olecranon process   Olecranon process   10 cm proximal to ulnar styloid process   Just proximal to ulnar styloid process   Across hand at thumb web space   At base of 2nd digit    (Blank rows = not tested)      QUICK DASH SURVEY: 30%   TODAY'S TREATMENT:  DATE:  12/15/22: Manual Therapy MFR: To bil anterior chest walls and axilla during P/ROM P/ROM: In Supine to Lt shoulder into flexion, abduction and D2 to pts available end motions, then same to Rt but more time was spent on tighter Lt side  12/11/22: Reviewed 4 post op exercises and performed flexion and scaption with wand, supine star gazer, wall slide and scapular retraction x 5 ea. Will substitute wand in place of claped hands flexion.  PATIENT EDUCATION:  Education details: supine wand flexion and scaption, reviewed 4 post op exercises x 4-5 reps ea Person educated: Patient Education method: Explanation, Demonstration, and Handouts Education comprehension: verbalized understanding and returned demonstration  HOME EXERCISE PROGRAM: Supine wand flex and scaption, post op exercises  ASSESSMENT:  CLINICAL IMPRESSION: First session of manual therapy. Pt required min VC's to relax but was able to do so with cuing. She reports feeling good stretches during and after session reports overall bil upper quadrants feeling much looser.   OBJECTIVE IMPAIRMENTS: decreased activity tolerance, decreased knowledge of condition, decreased mobility, decreased ROM, decreased strength, increased edema, impaired UE functional use, and postural dysfunction.   ACTIVITY LIMITATIONS: carrying, lifting, sleeping, and reach over head  PARTICIPATION LIMITATIONS: cleaning, occupation, and yard work  PERSONAL FACTORS: 1 comorbidity: BRCA2+  are also affecting patient's functional outcome.   REHAB POTENTIAL: Excellent  CLINICAL DECISION MAKING: Stable/uncomplicated  EVALUATION COMPLEXITY: Low  GOALS: Goals reviewed with patient? Yes  SHORT TERM GOALS= LONG TERM GOALS: Target date:  01/22/2023  Pt will be independent in a HEP for ROM and strengthening Baseline: Goal status: INITIAL  2.  Pt will have bilateral shoulder flexion and abduction atleast 150 degrees for improved reahcing Baseline:  Goal status: INITIAL  3.  Pts quick dash will be no greater than 10% Baseline:  Goal status: INITIAL  4.  Pt will resume normal light to medium household tasks without complaint Baseline:  Goal status: INITIAL  5.  Pt will have improved sleep Baseline:  Goal status: INITIAL  PLAN:  PT FREQUENCY: 2x/week  PT DURATION: 6 weeks  PLANNED INTERVENTIONS: Therapeutic exercises, Therapeutic activity, Neuromuscular re-education, Patient/Family education, Self Care, Joint mobilization, scar mobilization, Manual therapy, and Re-evaluation  PLAN FOR NEXT SESSION: STM to bilateral upper quarter, AAROM pulleys, PROM , assess incisions and start scar massage when ready, MFR techniques to rib area/chest/axilla   Otelia Limes, PTA 12/15/2022, 11:04 AM

## 2022-12-17 ENCOUNTER — Ambulatory Visit: Payer: No Typology Code available for payment source

## 2022-12-17 DIAGNOSIS — R293 Abnormal posture: Secondary | ICD-10-CM | POA: Diagnosis not present

## 2022-12-17 DIAGNOSIS — Z9013 Acquired absence of bilateral breasts and nipples: Secondary | ICD-10-CM

## 2022-12-17 DIAGNOSIS — Z1501 Genetic susceptibility to malignant neoplasm of breast: Secondary | ICD-10-CM

## 2022-12-17 NOTE — Therapy (Signed)
OUTPATIENT PHYSICAL THERAPY ONCOLOGY TREATMENT  Patient Name: Whitney Jenkins MRN: [id number] DOB:October 25, 1975, 47 y.o., female Today's Date: 12/17/2022  END OF SESSION:  PT End of Session - 12/17/22 1454     Visit Number 3    Number of Visits 12    Date for PT Re-Evaluation 01/22/23    PT Start Time [id number]    PT Stop Time 1451    PT Time Calculation (min) 48 min    Activity Tolerance Patient tolerated treatment well    Behavior During Therapy WFL for tasks assessed/performed             Past Medical History:  Diagnosis Date   Anemia    was on iron pills but states she has been off for a while   Chronic chest wall pain    Family history of breast cancer    Family history of breast cancer in female    Family history of pancreatic cancer    FH: BRCA2 gene positive    GERD (gastroesophageal reflux disease)    takes Omeprazole daily   History of migraine    last one 2wks ago   Leukopenia 12/11/2017   Ulcerative colitis ([place])    Followed by Dr. Collene Mares   Vitamin B12 deficiency 12/11/2017   Past Surgical History:  Procedure Laterality Date   CHOLECYSTECTOMY N/A 02/22/2014   Procedure: LAPAROSCOPIC CHOLECYSTECTOMY;  Surgeon: Harl Bowie, MD;  Location: [facility];  Service: General;  Laterality: N/A;   KNEE ARTHROSCOPY Left    LAPAROSCOPIC BILATERAL SALPINGO OOPHERECTOMY Bilateral 10/20/2018   Procedure: LAPAROSCOPIC BILATERAL SALPINGO OOPHORECTOMY;  Surgeon: Linda Hedges, DO;  Location: [facility];  Service: Gynecology;  Laterality: Bilateral;   MYOMECTOMY     TOTAL MASTECTOMY Bilateral 11/24/2022   Procedure: BILATERAL TOTAL MASTECTOMY;  Surgeon: Rolm Bookbinder, MD;  Location: [facility];  Service: General;  Laterality: Bilateral;   Patient Active Problem List   Diagnosis Date Noted   S/P bilateral mastectomy 11/24/2022   Costochondritis 08/31/2020   Bilateral shoulder pain 08/31/2020   Low back pain 08/31/2020   High risk medication use  08/31/2020   Sacroiliitis, not elsewhere classified ([place]) 08/31/2020   Chronic chest wall pain 07/04/2020   History of bilateral oophorectomies 07/04/2020   Low TSH level 03/21/2020   Family history of thyroid disease in sister 03/21/2020   Leukopenia 12/11/2017   Vitamin B12 deficiency 12/11/2017   Ulcerative colitis, unspecified, without complications ([place]) [id number]   Uterine fibroid 11/06/2015   Genetic testing 02/22/2015   BRCA2 positive 02/22/2015   FH: BRCA2 gene positive    Family history of breast cancer    Family history of breast cancer in female    Family history of pancreatic cancer    Chronic cholecystitis 02/17/2014      REFERRING PROVIDER: Rolm Bookbinder, MD  REFERRING DIAG: Bilateral Risk reducing mastectomies  THERAPY DIAG:  Genetic susceptibility to malignant neoplasm of breast  BRCA2 positive  Abnormal posture  H/O bilateral mastectomy  ONSET DATE: 11/22/2022  Rationale for Evaluation and Treatment: Rehabilitation  SUBJECTIVE:  SUBJECTIVE STATEMENT I felt better after she massaged my pecs last time. I feel like ROM is improving   PERTINENT HISTORY:  Pt has family history of breast Cancer in her mom and sister with mutation that led her to do testing. She has had Salpingo-oophorectomy already. She is now s/p Bilateral  risk reducing Mastectomies on 11/24/2022 and does not wish to do reconstruction. Drains were removed on March 7,2024. She was given some exercises by MD.  PAIN:  Are you having pain? Nomore soreness than pain, Left greater than right chest,   PRECAUTIONS: None  WEIGHT BEARING RESTRICTIONS: No  FALLS:  Has patient fallen in last 6 months? No  LIVING ENVIRONMENT: Lives with: lives with their spouse Lives in: House/apartment Stairs: Yes;  External: 1 flight steps; on right going up Has following equipment at home: None  OCCUPATION: Medical illustrator  LEISURE: basketball, walk, watch TV,  HAND DOMINANCE: right   PRIOR LEVEL OF FUNCTION: Independent  PATIENT GOALS: Get back ROM/strength   OBJECTIVE:  COGNITION: Overall cognitive status: Within functional limits for tasks assessed   PALPATION: Tender globally bilateral chest and rib cage  OBSERVATIONS / OTHER ASSESSMENTS: Mastectomy incisions covered with steri strips, mild swelling left greater than righ axillary regions/lateral trunk  SENSATION: Light touch: Deficits     POSTURE: forward head, round shoulders  UPPER EXTREMITY AROM/PROM:  A/PROM RIGHT   eval   Shoulder extension 55  Shoulder flexion 127, tight at ribs from drains  Shoulder abduction 110  ribs  Shoulder internal rotation 62  Shoulder external rotation 84    (Blank rows = not tested)  A/PROM LEFT   eval  Shoulder extension 42  Shoulder flexion 113  Shoulder abduction 80  Shoulder internal rotation   Shoulder external rotation     (Blank rows = not tested)  CERVICAL AROM: All within functional limits:      UPPER EXTREMITY STRENGTH: NT due to sx   LYMPHEDEMA ASSESSMENTS:   SURGERY TYPE/DATE: 11/24/2022, Bilateral risk reducing mastectomies for BRCA2 positive gene  NUMBER OF LYMPH NODES REMOVED: 2 breast LN's ?  CHEMOTHERAPY: No  RADIATION:NO  HORMONE TREATMENT: NO  INFECTIONS: NO  LYMPHEDEMA ASSESSMENTS:   LANDMARK RIGHT  eval  10 cm proximal to olecranon process   Olecranon process   10 cm proximal to ulnar styloid process   Just proximal to ulnar styloid process   Across hand at thumb web space   At base of 2nd digit   (Blank rows = not tested)  LANDMARK LEFT  eval  10 cm proximal to olecranon process   Olecranon process   10 cm proximal to ulnar styloid process   Just proximal to ulnar styloid process   Across hand at thumb web space   At base of  2nd digit   (Blank rows = not tested)      QUICK DASH SURVEY: 30%   TODAY'S TREATMENT:  DATE:  12/17/2022 Pulleys flexion 1 min, scaption and abd x 1:30 sec STM bilateral pectorals,UT, lateral trunk with cocoa butter Supine wand flexion, scaption x 5 P/ROM: In Supine to bilateral shoulder into flexion, abduction and D2 , and ER LTR bilaterally with arms extended x 5 ea 12/15/22: Manual Therapy MFR: To bil anterior chest walls and axilla during P/ROM P/ROM: In Supine to Lt shoulder into flexion, abduction and D2 to pts available end motions, then same to Rt but more time was spent on tighter Lt side  12/11/22: Reviewed 4 post op exercises and performed flexion and scaption with wand, supine star gazer, wall slide and scapular retraction x 5 ea. Will substitute wand in place of claped hands flexion.  PATIENT EDUCATION:  Education details: supine wand flexion and scaption, reviewed 4 post op exercises x 4-5 reps ea Person educated: Patient Education method: Explanation, Demonstration, and Handouts Education comprehension: verbalized understanding and returned demonstration  HOME EXERCISE PROGRAM: Supine wand flex and scaption, post op exercises  ASSESSMENT:  CLINICAL IMPRESSION: Pt with bilateral pectoral tightness/tenderness, and tenderness at bilateral lateral trunk. Shoulder ROM visibly improved after treatment. Pt felt good stretch with LTR with arms extended.  OBJECTIVE IMPAIRMENTS: decreased activity tolerance, decreased knowledge of condition, decreased mobility, decreased ROM, decreased strength, increased edema, impaired UE functional use, and postural dysfunction.   ACTIVITY LIMITATIONS: carrying, lifting, sleeping, and reach over head  PARTICIPATION LIMITATIONS: cleaning, occupation, and yard work  PERSONAL FACTORS: 1 comorbidity:  BRCA2+  are also affecting patient's functional outcome.   REHAB POTENTIAL: Excellent  CLINICAL DECISION MAKING: Stable/uncomplicated  EVALUATION COMPLEXITY: Low  GOALS: Goals reviewed with patient? Yes  SHORT TERM GOALS= LONG TERM GOALS: Target date: 01/22/2023  Pt will be independent in a HEP for ROM and strengthening Baseline: Goal status: INITIAL  2.  Pt will have bilateral shoulder flexion and abduction atleast 150 degrees for improved reahcing Baseline:  Goal status: INITIAL  3.  Pts quick dash will be no greater than 10% Baseline:  Goal status: INITIAL  4.  Pt will resume normal light to medium household tasks without complaint Baseline:  Goal status: INITIAL  5.  Pt will have improved sleep Baseline:  Goal status: INITIAL  PLAN:  PT FREQUENCY: 2x/week  PT DURATION: 6 weeks  PLANNED INTERVENTIONS: Therapeutic exercises, Therapeutic activity, Neuromuscular re-education, Patient/Family education, Self Care, Joint mobilization, scar mobilization, Manual therapy, and Re-evaluation  PLAN FOR NEXT SESSION: measure ROM next,STM to bilateral upper quarter,  PROM , assess incisions and start scar massage when ready, MFR techniques to rib area/chest/axilla. Has pulleys at home.   Claris Pong, PT 12/17/2022, 2:56 PM

## 2022-12-23 ENCOUNTER — Ambulatory Visit: Payer: No Typology Code available for payment source | Admitting: Physical Therapy

## 2022-12-23 DIAGNOSIS — R293 Abnormal posture: Secondary | ICD-10-CM

## 2022-12-23 DIAGNOSIS — Z9013 Acquired absence of bilateral breasts and nipples: Secondary | ICD-10-CM

## 2022-12-23 NOTE — Therapy (Signed)
OUTPATIENT PHYSICAL THERAPY ONCOLOGY TREATMENT  Patient Name: Whitney Jenkins MRN: [id number] DOB:25-Jun-1976, 47 y.o., female Today's Date: 12/23/2022  END OF SESSION:  PT End of Session - 12/23/22 1113     Visit Number 4    Number of Visits 12    Date for PT Re-Evaluation 01/22/23    PT Start Time 1000    PT Stop Time 1058    PT Time Calculation (min) 58 min    Activity Tolerance Patient tolerated treatment well    Behavior During Therapy WFL for tasks assessed/performed             Past Medical History:  Diagnosis Date   Anemia    was on iron pills but states she has been off for a while   Chronic chest wall pain    Family history of breast cancer    Family history of breast cancer in female    Family history of pancreatic cancer    FH: BRCA2 gene positive    GERD (gastroesophageal reflux disease)    takes Omeprazole daily   History of migraine    last one 2wks ago   Leukopenia 12/11/2017   Ulcerative colitis ([place])    Followed by Dr. Collene Mares   Vitamin B12 deficiency 12/11/2017   Past Surgical History:  Procedure Laterality Date   CHOLECYSTECTOMY N/A 02/22/2014   Procedure: LAPAROSCOPIC CHOLECYSTECTOMY;  Surgeon: Harl Bowie, MD;  Location: [facility];  Service: General;  Laterality: N/A;   KNEE ARTHROSCOPY Left    LAPAROSCOPIC BILATERAL SALPINGO OOPHERECTOMY Bilateral 10/20/2018   Procedure: LAPAROSCOPIC BILATERAL SALPINGO OOPHORECTOMY;  Surgeon: Linda Hedges, DO;  Location: [facility];  Service: Gynecology;  Laterality: Bilateral;   MYOMECTOMY     TOTAL MASTECTOMY Bilateral 11/24/2022   Procedure: BILATERAL TOTAL MASTECTOMY;  Surgeon: Rolm Bookbinder, MD;  Location: [facility];  Service: General;  Laterality: Bilateral;   Patient Active Problem List   Diagnosis Date Noted   S/P bilateral mastectomy 11/24/2022   Costochondritis 08/31/2020   Bilateral shoulder pain 08/31/2020   Low back pain 08/31/2020   High risk medication use  08/31/2020   Sacroiliitis, not elsewhere classified ([place]) 08/31/2020   Chronic chest wall pain 07/04/2020   History of bilateral oophorectomies 07/04/2020   Low TSH level 03/21/2020   Family history of thyroid disease in sister 03/21/2020   Leukopenia 12/11/2017   Vitamin B12 deficiency 12/11/2017   Ulcerative colitis, unspecified, without complications ([place]) [id number]   Uterine fibroid 11/06/2015   Genetic testing 02/22/2015   BRCA2 positive 02/22/2015   FH: BRCA2 gene positive    Family history of breast cancer    Family history of breast cancer in female    Family history of pancreatic cancer    Chronic cholecystitis 02/17/2014      REFERRING PROVIDER: Rolm Bookbinder, MD  REFERRING DIAG: Bilateral Risk reducing mastectomies  THERAPY DIAG:  Abnormal posture  H/O bilateral mastectomy  ONSET DATE: 11/22/2022  Rationale for Evaluation and Treatment: Rehabilitation  SUBJECTIVE:  SUBJECTIVE STATEMENT     Pt no longer takes pain medicine or muscle relaxation.  She is able to lie on her back without difficulty and is doing all the exercises she has been given at home. She can tell she is getting better because she can put her shirt on like she used to  PERTINENT HISTORY:  Pt has family history of breast Cancer in her mom and sister with mutation that led her to do testing. She has had Salpingo-oophorectomy already. She is now s/p Bilateral  risk reducing Mastectomies on 11/24/2022 and does not wish to do reconstruction. Drains were removed on March 7,2024. She was given some exercises by MD.  PAIN:  Are you having pain? Nomore soreness than pain, Left greater than right chest,   PRECAUTIONS: None  WEIGHT BEARING RESTRICTIONS: No  FALLS:  Has patient fallen in last 6 months? No  LIVING  ENVIRONMENT: Lives with: lives with their spouse Lives in: House/apartment Stairs: Yes; External: 1 flight steps; on right going up Has following equipment at home: None  OCCUPATION: Medical illustrator  LEISURE: basketball, walk, watch TV,  HAND DOMINANCE: right   PRIOR LEVEL OF FUNCTION: Independent  PATIENT GOALS: Get back ROM/strength   OBJECTIVE:  COGNITION: Overall cognitive status: Within functional limits for tasks assessed   PALPATION: Tender globally bilateral chest and rib cage. Tightness in both pec groups   OBSERVATIONS / OTHER ASSESSMENTS: Mastectomy incisions covered with steri strips, mild swelling left greater than righ axillary regions/lateral trunk  SENSATION: Light touch: Deficits     POSTURE: forward head, round shoulders  UPPER EXTREMITY AROM/PROM:  A/PROM RIGHT   eval  RIGHT  12/23/2022  Shoulder extension 55 70  Shoulder flexion 127, tight at ribs from drains 163  Shoulder abduction 110  ribs 145  Shoulder internal rotation 62 55  Shoulder external rotation 84 90    (Blank rows = not tested)  A/PROM LEFT   eval LEFT 12/23/2022  Shoulder extension 42 78  Shoulder flexion 113 140  Shoulder abduction 80 121  Shoulder internal rotation  55  Shoulder external rotation  90    (Blank rows = not tested)  CERVICAL AROM: All within functional limits:      UPPER EXTREMITY STRENGTH: NT due to sx   LYMPHEDEMA ASSESSMENTS:   SURGERY TYPE/DATE: 11/24/2022, Bilateral risk reducing mastectomies for BRCA2 positive gene  NUMBER OF LYMPH NODES REMOVED: 2 breast LN's ?  CHEMOTHERAPY: No  RADIATION:NO  HORMONE TREATMENT: NO  INFECTIONS: NO  LYMPHEDEMA ASSESSMENTS:   LANDMARK RIGHT  eval  10 cm proximal to olecranon process   Olecranon process   10 cm proximal to ulnar styloid process   Just proximal to ulnar styloid process   Across hand at thumb web space   At base of 2nd digit   (Blank rows = not tested)  LANDMARK LEFT  eval  10  cm proximal to olecranon process   Olecranon process   10 cm proximal to ulnar styloid process   Just proximal to ulnar styloid process   Across hand at thumb web space   At base of 2nd digit   (Blank rows = not tested)      QUICK DASH SURVEY: 30%   TODAY'S TREATMENT:  DATE:  12/23/2022 Remeasured ROM Began with 1-2 reps of neck, scapular, shoulder, thoracic an lumbar spine ROM in sitting. Supine STM with coco butter to bilateral pecs and lateral trunk. Shoulder stretches in supine and in sidelying for shoulder flexion and abduction with assist to stretch left scapula into more upward rotation to allow for more shoulder flexion. Continued with lower trunk rotation and pelvic tilts in addition to goal post arms in supine.  12/17/2022 Pulleys flexion 1 min, scaption and abd x 1:30 sec STM bilateral pectorals,UT, lateral trunk with cocoa butter Supine wand flexion, scaption x 5 P/ROM: In Supine to bilateral shoulder into flexion, abduction and D2 , and ER LTR bilaterally with arms extended x 5 ea 12/15/22: Manual Therapy MFR: To bil anterior chest walls and axilla during P/ROM P/ROM: In Supine to Lt shoulder into flexion, abduction and D2 to pts available end motions, then same to Rt but more time was spent on tighter Lt side  12/11/22: Reviewed 4 post op exercises and performed flexion and scaption with wand, supine star gazer, wall slide and scapular retraction x 5 ea. Will substitute wand in place of claped hands flexion.  PATIENT EDUCATION:  Education details: supine wand flexion and scaption, reviewed 4 post op exercises x 4-5 reps ea Person educated: Patient Education method: Explanation, Demonstration, and Handouts Education comprehension: verbalized understanding and returned demonstration  HOME EXERCISE PROGRAM: Supine wand flex and scaption,  post op exercises  ASSESSMENT:  CLINICAL IMPRESSION: Pt has made significant gains in shoulder ROM since eval. She continues with bilateral pectoral anl lateral trunk. Pt able to tolerate more stretches in different positions for more muliplanar stretches.  Pt will benefit from continued PT to decrease pec and lat tightness and offer more active stretches and exercises.  Some tape is still on incisions as she continues to heal so is not quite ready for aggressive scar massage yet.   OBJECTIVE IMPAIRMENTS: decreased activity tolerance, decreased knowledge of condition, decreased mobility, decreased ROM, decreased strength, increased edema, impaired UE functional use, and postural dysfunction.   ACTIVITY LIMITATIONS: carrying, lifting, sleeping, and reach over head  PARTICIPATION LIMITATIONS: cleaning, occupation, and yard work  PERSONAL FACTORS: 1 comorbidity: BRCA2+  are also affecting patient's functional outcome.   REHAB POTENTIAL: Excellent  CLINICAL DECISION MAKING: Stable/uncomplicated  EVALUATION COMPLEXITY: Low  GOALS: Goals reviewed with patient? Yes  SHORT TERM GOALS= LONG TERM GOALS: Target date: 01/22/2023  Pt will be independent in a HEP for ROM and strengthening Baseline: Goal status: INITIAL  2.  Pt will have bilateral shoulder flexion and abduction atleast 150 degrees for improved reahcing Baseline:  Goal status: INITIAL  3.  Pts quick dash will be no greater than 10% Baseline:  Goal status: INITIAL  4.  Pt will resume normal light to medium household tasks without complaint Baseline:  Goal status: INITIAL  5.  Pt will have improved sleep Baseline:  Goal status: INITIAL  PLAN:  PT FREQUENCY: 2x/week  PT DURATION: 6 weeks  PLANNED INTERVENTIONS: Therapeutic exercises, Therapeutic activity, Neuromuscular re-education, Patient/Family education, Self Care, Joint mobilization, scar mobilization, Manual therapy, and Re-evaluation  PLAN FOR NEXT SESSION:  Cont ,STM to bilateral upper quarter,  PROM , and add AROM in mulitplanar exercises as well as with pelvic tilts and lumbar rotation. assess incisions and start scar massage when ready, MFR techniques to rib area/chest/axilla. Has pulleys at home.    Encounter Date: 12/23/2022  Donato Heinz. Owens Shark PT  Norwood Levo, PT 12/23/2022, 11:14 AM  [facility] [address] [place], [place], [address] Phone: [phone]   Fax:  [phone]

## 2022-12-25 ENCOUNTER — Ambulatory Visit: Payer: No Typology Code available for payment source | Admitting: Physical Therapy

## 2022-12-25 ENCOUNTER — Encounter: Payer: Self-pay | Admitting: Physical Therapy

## 2022-12-25 DIAGNOSIS — R293 Abnormal posture: Secondary | ICD-10-CM | POA: Diagnosis not present

## 2022-12-25 DIAGNOSIS — Z1509 Genetic susceptibility to other malignant neoplasm: Secondary | ICD-10-CM

## 2022-12-25 DIAGNOSIS — Z1501 Genetic susceptibility to malignant neoplasm of breast: Secondary | ICD-10-CM

## 2022-12-25 DIAGNOSIS — Z9013 Acquired absence of bilateral breasts and nipples: Secondary | ICD-10-CM

## 2022-12-25 NOTE — Therapy (Addendum)
OUTPATIENT PHYSICAL THERAPY ONCOLOGY TREATMENT  Patient Name: Whitney Jenkins MRN: [id number] DOB:08-25-1976, 47 y.o., female Today's Date: 12/25/2022  END OF SESSION:  PT End of Session - 12/25/22 1242     Visit Number 5    Number of Visits 12    Date for PT Re-Evaluation 01/22/23    PT Start Time 1000    PT Stop Time 1053    PT Time Calculation (min) 53 min              Past Medical History:  Diagnosis Date   Anemia    was on iron pills but states she has been off for a while   Chronic chest wall pain    Family history of breast cancer    Family history of breast cancer in female    Family history of pancreatic cancer    FH: BRCA2 gene positive    GERD (gastroesophageal reflux disease)    takes Omeprazole daily   History of migraine    last one 2wks ago   Leukopenia 12/11/2017   Ulcerative colitis ([place])    Followed by Dr. Collene Mares   Vitamin B12 deficiency 12/11/2017   Past Surgical History:  Procedure Laterality Date   CHOLECYSTECTOMY N/A 02/22/2014   Procedure: LAPAROSCOPIC CHOLECYSTECTOMY;  Surgeon: Harl Bowie, MD;  Location: [facility];  Service: General;  Laterality: N/A;   KNEE ARTHROSCOPY Left    LAPAROSCOPIC BILATERAL SALPINGO OOPHERECTOMY Bilateral 10/20/2018   Procedure: LAPAROSCOPIC BILATERAL SALPINGO OOPHORECTOMY;  Surgeon: Linda Hedges, DO;  Location: [facility];  Service: Gynecology;  Laterality: Bilateral;   MYOMECTOMY     TOTAL MASTECTOMY Bilateral 11/24/2022   Procedure: BILATERAL TOTAL MASTECTOMY;  Surgeon: Rolm Bookbinder, MD;  Location: [facility];  Service: General;  Laterality: Bilateral;   Patient Active Problem List   Diagnosis Date Noted   S/P bilateral mastectomy 11/24/2022   Costochondritis 08/31/2020   Bilateral shoulder pain 08/31/2020   Low back pain 08/31/2020   High risk medication use 08/31/2020   Sacroiliitis, not elsewhere classified ([place]) 08/31/2020   Chronic chest wall pain 07/04/2020    History of bilateral oophorectomies 07/04/2020   Low TSH level 03/21/2020   Family history of thyroid disease in sister 03/21/2020   Leukopenia 12/11/2017   Vitamin B12 deficiency 12/11/2017   Ulcerative colitis, unspecified, without complications ([place]) [id number]   Uterine fibroid 11/06/2015   Genetic testing 02/22/2015   BRCA2 positive 02/22/2015   FH: BRCA2 gene positive    Family history of breast cancer    Family history of breast cancer in female    Family history of pancreatic cancer    Chronic cholecystitis 02/17/2014      REFERRING PROVIDER: Rolm Bookbinder, MD  REFERRING DIAG: Bilateral Risk reducing mastectomies  THERAPY DIAG:  Abnormal posture  H/O bilateral mastectomy  Genetic susceptibility to malignant neoplasm of breast  BRCA2 positive  ONSET DATE: 11/22/2022  Rationale for Evaluation and Treatment: Rehabilitation  SUBJECTIVE:  SUBJECTIVE STATEMENT     Pt reports she is not having any pain since last session. She continues to feel bettter  PERTINENT HISTORY:  Pt has family history of breast Cancer in her mom and sister with mutation that led her to do testing. She has had Salpingo-oophorectomy already. She is now s/p Bilateral  risk reducing Mastectomies on 11/24/2022 and does not wish to do reconstruction. Drains were removed on March 7,2024. She was given some exercises by MD.  PAIN:  Are you having pain? Nomore soreness than pain, Left greater than right chest,   PRECAUTIONS: None  WEIGHT BEARING RESTRICTIONS: No  FALLS:  Has patient fallen in last 6 months? No  LIVING ENVIRONMENT: Lives with: lives with their spouse Lives in: House/apartment Stairs: Yes; External: 1 flight steps; on right going up Has following equipment at home: None  OCCUPATION: Tax adviser  LEISURE: basketball, walk, watch TV,  HAND DOMINANCE: right   PRIOR LEVEL OF FUNCTION: Independent  PATIENT GOALS: Get back ROM/strength   OBJECTIVE:  COGNITION: Overall cognitive status: Within functional limits for tasks assessed   PALPATION: Tender globally bilateral chest and rib cage. Tightness in both pec groups   OBSERVATIONS / OTHER ASSESSMENTS: Mastectomy incisions covered with steri strips, mild swelling left greater than righ axillary regions/lateral trunk  SENSATION: Light touch: Deficits     POSTURE: forward head, round shoulders  UPPER EXTREMITY AROM/PROM:  A/PROM RIGHT   eval  RIGHT  12/23/2022  Shoulder extension 55 70  Shoulder flexion 127, tight at ribs from drains 163  Shoulder abduction 110  ribs 145  Shoulder internal rotation 62 55  Shoulder external rotation 84 90    (Blank rows = not tested)  A/PROM LEFT   eval LEFT 12/23/2022  Shoulder extension 42 78  Shoulder flexion 113 140  Shoulder abduction 80 121  Shoulder internal rotation  55  Shoulder external rotation  90    (Blank rows = not tested)  CERVICAL AROM: All within functional limits:      UPPER EXTREMITY STRENGTH: NT due to sx   LYMPHEDEMA ASSESSMENTS:   SURGERY TYPE/DATE: 11/24/2022, Bilateral risk reducing mastectomies for BRCA2 positive gene  NUMBER OF LYMPH NODES REMOVED: 2 breast LN's ?  CHEMOTHERAPY: No  RADIATION:NO  HORMONE TREATMENT: NO  INFECTIONS: NO  LYMPHEDEMA ASSESSMENTS:   LANDMARK RIGHT  eval  10 cm proximal to olecranon process   Olecranon process   10 cm proximal to ulnar styloid process   Just proximal to ulnar styloid process   Across hand at thumb web space   At base of 2nd digit   (Blank rows = not tested)  LANDMARK LEFT  eval  10 cm proximal to olecranon process   Olecranon process   10 cm proximal to ulnar styloid process   Just proximal to ulnar styloid process   Across hand at thumb web space   At base of 2nd digit    (Blank rows = not tested)      QUICK DASH SURVEY: 30%   TODAY'S TREATMENT:  DATE:  3/28/204: Sitting scapular rolls, trunk twists and sidebending. Supine lower trunk rotation with feet aligned and wide  Pelvic tilts forward and back and laterally on mat and with purple ball under sacrum , bridges. Purple ball under thoracic spine for scapular retraction, shoulder ROM in all planes. Pt progressed to lying on half roller for more abd work and anterior abdominal stretching. Supine STM with coco butter to bilateral pecs and lateral trunk.  12/23/2022 Remeasured ROM Began with 1-2 reps of neck, scapular, shoulder, thoracic an lumbar spine ROM in sitting. Supine STM with coco butter to bilateral pecs and lateral trunk. Shoulder stretches in supine and in sidelying for shoulder flexion and abduction with assist to stretch left scapula into more upward rotation to allow for more shoulder flexion. Continued with lower trunk rotation and pelvic tilts in addition to goal post arms in supine.  12/17/2022 Pulleys flexion 1 min, scaption and abd x 1:30 sec STM bilateral pectorals,UT, lateral trunk with cocoa butter Supine wand flexion, scaption x 5 P/ROM: In Supine to bilateral shoulder into flexion, abduction and D2 , and ER LTR bilaterally with arms extended x 5 ea 12/15/22: Manual Therapy MFR: To bil anterior chest walls and axilla during P/ROM P/ROM: In Supine to Lt shoulder into flexion, abduction and D2 to pts available end motions, then same to Rt but more time was spent on tighter Lt side  12/11/22: Reviewed 4 post op exercises and performed flexion and scaption with wand, supine star gazer, wall slide and scapular retraction x 5 ea. Will substitute wand in place of claped hands flexion.  PATIENT EDUCATION:  Education details: supine wand flexion and scaption,  reviewed 4 post op exercises x 4-5 reps ea Person educated: Patient Education method: Consulting civil engineer, Demonstration, and Handouts Education comprehension: verbalized understanding and returned demonstration  HOME EXERCISE PROGRAM: Supine wand flex and scaption, post op exercises  ASSESSMENT:  CLINICAL IMPRESSION: Pt with continued improvement in shoulder ROM and activity tolerance and was able to progress to more progressive stretching and abdominal work with core stabalization while she exercised her shoulders today. Right pec less congested today after treatment. So will focus on left pec next session  OBJECTIVE IMPAIRMENTS: decreased activity tolerance, decreased knowledge of condition, decreased mobility, decreased ROM, decreased strength, increased edema, impaired UE functional use, and postural dysfunction.   ACTIVITY LIMITATIONS: carrying, lifting, sleeping, and reach over head  PARTICIPATION LIMITATIONS: cleaning, occupation, and yard work  PERSONAL FACTORS: 1 comorbidity: BRCA2+  are also affecting patient's functional outcome.   REHAB POTENTIAL: Excellent  CLINICAL DECISION MAKING: Stable/uncomplicated  EVALUATION COMPLEXITY: Low  GOALS: Goals reviewed with patient? Yes  SHORT TERM GOALS= LONG TERM GOALS: Target date: 01/22/2023  Pt will be independent in a HEP for ROM and strengthening Baseline: Goal status: INITIAL  2.  Pt will have bilateral shoulder flexion and abduction atleast 150 degrees for improved reahcing Baseline:  Goal status: INITIAL  3.  Pts quick dash will be no greater than 10% Baseline:  Goal status: INITIAL  4.  Pt will resume normal light to medium household tasks without complaint Baseline:  Goal status: INITIAL  5.  Pt will have improved sleep Baseline:  Goal status: INITIAL  PLAN:  PT FREQUENCY: 2x/week  PT DURATION: 6 weeks  PLANNED INTERVENTIONS: Therapeutic exercises, Therapeutic activity, Neuromuscular re-education,  Patient/Family education, Self Care, Joint mobilization, scar mobilization, Manual therapy, and Re-evaluation  PLAN FOR NEXT SESSION: Cont ,STM to bilateral upper quarter, but focus on left pec next session  PROM , and add AROM in mulitplanar exercises as well as with pelvic tilts and lumbar rotation. Progress to lying on full foam roller assess incisions and start scar massage when ready, MFR techniques to rib area/chest/axilla. Has pulleys at home.    Encounter Date: 12/25/2022  Donato Heinz. Owens Shark PT  Norwood Levo, PT 12/25/2022, 12:44 PM  [facility] [address] [place], [place], [address] Phone: [phone]   Fax:  [facility] [address] [place], [place], [address] Phone: [phone]   Fax:  [phone]

## 2022-12-29 ENCOUNTER — Ambulatory Visit: Payer: No Typology Code available for payment source | Attending: General Surgery | Admitting: Physical Therapy

## 2022-12-29 DIAGNOSIS — Z1501 Genetic susceptibility to malignant neoplasm of breast: Secondary | ICD-10-CM | POA: Insufficient documentation

## 2022-12-29 DIAGNOSIS — Z1509 Genetic susceptibility to other malignant neoplasm: Secondary | ICD-10-CM | POA: Diagnosis present

## 2022-12-29 DIAGNOSIS — R293 Abnormal posture: Secondary | ICD-10-CM | POA: Diagnosis present

## 2022-12-29 DIAGNOSIS — Z9013 Acquired absence of bilateral breasts and nipples: Secondary | ICD-10-CM | POA: Insufficient documentation

## 2022-12-29 NOTE — Patient Instructions (Signed)

## 2022-12-29 NOTE — Therapy (Signed)
OUTPATIENT PHYSICAL THERAPY ONCOLOGY TREATMENT  Patient Name: Whitney Jenkins MRN: [id number] DOB:November 13, 1975, 47 y.o., female Today's Date: 12/29/2022  END OF SESSION:  PT End of Session - 12/29/22 1209     Visit Number 6    Date for PT Re-Evaluation 01/22/23    PT Start Time 1015    PT Stop Time 1100    PT Time Calculation (min) 45 min    Activity Tolerance Patient tolerated treatment well    Behavior During Therapy WFL for tasks assessed/performed               Past Medical History:  Diagnosis Date   Anemia    was on iron pills but states she has been off for a while   Chronic chest wall pain    Family history of breast cancer    Family history of breast cancer in female    Family history of pancreatic cancer    FH: BRCA2 gene positive    GERD (gastroesophageal reflux disease)    takes Omeprazole daily   History of migraine    last one 2wks ago   Leukopenia 12/11/2017   Ulcerative colitis ([place])    Followed by Dr. Collene Mares   Vitamin B12 deficiency 12/11/2017   Past Surgical History:  Procedure Laterality Date   CHOLECYSTECTOMY N/A 02/22/2014   Procedure: LAPAROSCOPIC CHOLECYSTECTOMY;  Surgeon: Harl Bowie, MD;  Location: [facility];  Service: General;  Laterality: N/A;   KNEE ARTHROSCOPY Left    LAPAROSCOPIC BILATERAL SALPINGO OOPHERECTOMY Bilateral 10/20/2018   Procedure: LAPAROSCOPIC BILATERAL SALPINGO OOPHORECTOMY;  Surgeon: Linda Hedges, DO;  Location: [facility];  Service: Gynecology;  Laterality: Bilateral;   MYOMECTOMY     TOTAL MASTECTOMY Bilateral 11/24/2022   Procedure: BILATERAL TOTAL MASTECTOMY;  Surgeon: Rolm Bookbinder, MD;  Location: [facility];  Service: General;  Laterality: Bilateral;   Patient Active Problem List   Diagnosis Date Noted   S/P bilateral mastectomy 11/24/2022   Costochondritis 08/31/2020   Bilateral shoulder pain 08/31/2020   Low back pain 08/31/2020   High risk medication use 08/31/2020    Sacroiliitis, not elsewhere classified 08/31/2020   Chronic chest wall pain 07/04/2020   History of bilateral oophorectomies 07/04/2020   Low TSH level 03/21/2020   Family history of thyroid disease in sister 03/21/2020   Leukopenia 12/11/2017   Vitamin B12 deficiency 12/11/2017   Ulcerative colitis, unspecified, without complications [id number]   Uterine fibroid 11/06/2015   Genetic testing 02/22/2015   BRCA2 positive 02/22/2015   FH: BRCA2 gene positive    Family history of breast cancer    Family history of breast cancer in female    Family history of pancreatic cancer    Chronic cholecystitis 02/17/2014      REFERRING PROVIDER: Rolm Bookbinder, MD  REFERRING DIAG: Bilateral Risk reducing mastectomies  THERAPY DIAG:  Abnormal posture  H/O bilateral mastectomy  ONSET DATE: 11/22/2022  Rationale for Evaluation and Treatment: Rehabilitation  SUBJECTIVE:  SUBJECTIVE STATEMENT     Pt is doing well.  PERTINENT HISTORY:  Pt has family history of breast Cancer in her mom and sister with mutation that led her to do testing. She has had Salpingo-oophorectomy already. She is now s/p Bilateral  risk reducing Mastectomies on 11/24/2022 and does not wish to do reconstruction. Drains were removed on March 7,2024. She was given some exercises by MD.  PAIN:  Are you having pain? Nomore soreness than pain, Left greater than right chest,   PRECAUTIONS: None  WEIGHT BEARING RESTRICTIONS: No  FALLS:  Has patient fallen in last 6 months? No  LIVING ENVIRONMENT: Lives with: lives with their spouse Lives in: House/apartment Stairs: Yes; External: 1 flight steps; on right going up Has following equipment at home: None  OCCUPATION: Medical illustrator  LEISURE: basketball, walk, watch TV,  HAND  DOMINANCE: right   PRIOR LEVEL OF FUNCTION: Independent  PATIENT GOALS: Get back ROM/strength   OBJECTIVE:  COGNITION: Overall cognitive status: Within functional limits for tasks assessed   PALPATION: Tender globally bilateral chest and rib cage. Tightness in both pec groups   OBSERVATIONS / OTHER ASSESSMENTS: Mastectomy incisions covered with steri strips, mild swelling left greater than righ axillary regions/lateral trunk  SENSATION: Light touch: Deficits     POSTURE: forward head, round shoulders  UPPER EXTREMITY AROM/PROM:  A/PROM RIGHT   eval  RIGHT  12/23/2022  Shoulder extension 55 70  Shoulder flexion 127, tight at ribs from drains 163  Shoulder abduction 110  ribs 145  Shoulder internal rotation 62 55  Shoulder external rotation 84 90    (Blank rows = not tested)  A/PROM LEFT   eval LEFT 12/23/2022  Shoulder extension 42 78  Shoulder flexion 113 140  Shoulder abduction 80 121  Shoulder internal rotation  55  Shoulder external rotation  90    (Blank rows = not tested)  CERVICAL AROM: All within functional limits:      UPPER EXTREMITY STRENGTH: NT due to sx   LYMPHEDEMA ASSESSMENTS:   SURGERY TYPE/DATE: 11/24/2022, Bilateral risk reducing mastectomies for BRCA2 positive gene  NUMBER OF LYMPH NODES REMOVED: 2 breast LN's ?  CHEMOTHERAPY: No  RADIATION:NO  HORMONE TREATMENT: NO  INFECTIONS: NO  LYMPHEDEMA ASSESSMENTS:   LANDMARK RIGHT  eval  10 cm proximal to olecranon process   Olecranon process   10 cm proximal to ulnar styloid process   Just proximal to ulnar styloid process   Across hand at thumb web space   At base of 2nd digit   (Blank rows = not tested)  LANDMARK LEFT  eval  10 cm proximal to olecranon process   Olecranon process   10 cm proximal to ulnar styloid process   Just proximal to ulnar styloid process   Across hand at thumb web space   At base of 2nd digit   (Blank rows = not tested)      QUICK DASH  SURVEY: 30%   TODAY'S TREATMENT:  DATE:  12/29/2022: Swelling in left chest especially at lateral chest seems to be decreasing.  Started with gentle neck upper thoracic and lower trunk rotation. Added stretching to anterior body fascia with foam roller under sacrum and pt with leg extension and alternating knees to chest. Supine and side lying STM with coco butter to left pecs and lateral trunk. Instructed in supine scap series with yellow theraband x 5 reps with cues to keep core stabilized and dynamic stretching  3/28/204: Sitting scapular rolls, trunk twists and sidebending. Supine lower trunk rotation with feet aligned and wide  Pelvic tilts forward and back and laterally on mat and with purple ball under sacrum , bridges. Purple ball under thoracic spine for scapular retraction, shoulder ROM in all planes. Pt progressed to lying on half roller for more abd work and anterior abdominal stretching. Supine STM with coco butter to bilateral pecs and lateral trunk.  12/23/2022 Remeasured ROM Began with 1-2 reps of neck, scapular, shoulder, thoracic an lumbar spine ROM in sitting. Supine STM with coco butter to bilateral pecs and lateral trunk. Shoulder stretches in supine and in sidelying for shoulder flexion and abduction with assist to stretch left scapula into more upward rotation to allow for more shoulder flexion. Continued with lower trunk rotation and pelvic tilts in addition to goal post arms in supine.  12/17/2022 Pulleys flexion 1 min, scaption and abd x 1:30 sec STM bilateral pectorals,UT, lateral trunk with cocoa butter Supine wand flexion, scaption x 5 P/ROM: In Supine to bilateral shoulder into flexion, abduction and D2 , and ER LTR bilaterally with arms extended x 5 ea 12/15/22: Manual Therapy MFR: To bil anterior chest walls and axilla during  P/ROM P/ROM: In Supine to Lt shoulder into flexion, abduction and D2 to pts available end motions, then same to Rt but more time was spent on tighter Lt side  12/11/22: Reviewed 4 post op exercises and performed flexion and scaption with wand, supine star gazer, wall slide and scapular retraction x 5 ea. Will substitute wand in place of claped hands flexion.  PATIENT EDUCATION:  Education details: supine scapular series  Person educated: Patient Education method: Explanation, Demonstration, and Handouts Education comprehension: verbalized understanding and returned demonstration  HOME EXERCISE PROGRAM: Supine wand flex and scaption, post op exercises. Supine scapular series   ASSESSMENT:  CLINICAL IMPRESSION: Continues improvement each session with less congestion in left chest and pec area, though pt still has tightness here.  Focused on core  and upper extremity stretching and began strengthening with supine scap series today  OBJECTIVE IMPAIRMENTS: decreased activity tolerance, decreased knowledge of condition, decreased mobility, decreased ROM, decreased strength, increased edema, impaired UE functional use, and postural dysfunction.   ACTIVITY LIMITATIONS: carrying, lifting, sleeping, and reach over head  PARTICIPATION LIMITATIONS: cleaning, occupation, and yard work  PERSONAL FACTORS: 1 comorbidity: BRCA2+  are also affecting patient's functional outcome.   REHAB POTENTIAL: Excellent  CLINICAL DECISION MAKING: Stable/uncomplicated  EVALUATION COMPLEXITY: Low  GOALS: Goals reviewed with patient? Yes  SHORT TERM GOALS= LONG TERM GOALS: Target date: 01/22/2023  Pt will be independent in a HEP for ROM and strengthening Baseline: Goal status: INITIAL  2.  Pt will have bilateral shoulder flexion and abduction atleast 150 degrees for improved reahcing Baseline:  Goal status: INITIAL  3.  Pts quick dash will be no greater than 10% Baseline:  Goal status: INITIAL  4.  Pt  will resume normal light to medium household tasks without complaint Baseline:  Goal status:  INITIAL  5.  Pt will have improved sleep Baseline:  Goal status: INITIAL  PLAN:  PT FREQUENCY: 2x/week  PT DURATION: 6 weeks  PLANNED INTERVENTIONS: Therapeutic exercises, Therapeutic activity, Neuromuscular re-education, Patient/Family education, Self Care, Joint mobilization, scar mobilization, Manual therapy, and Re-evaluation  PLAN FOR NEXT SESSION: Cont ,STM to bilateral upper quarter, but focus on left pec  PROM , and add AROM in mulitplanar exercises as well as with pelvic tilts and lumbar rotation. Progress to lying on full foam roller assess incisions and start scar massage when ready, MFR techniques to rib area/chest/axilla. Has pulleys at home.    Encounter Date: 12/29/2022  Donato Heinz. Owens Shark PT  Norwood Levo, PT 12/29/2022, 12:10 PM  [facility] [address] [place], [place], [address] Phone: [phone]   Fax:  [facility] [address] [place], [place], [address] Phone: [phone]   Fax:  [phone]

## 2022-12-31 ENCOUNTER — Ambulatory Visit: Payer: No Typology Code available for payment source

## 2022-12-31 DIAGNOSIS — Z9013 Acquired absence of bilateral breasts and nipples: Secondary | ICD-10-CM

## 2022-12-31 DIAGNOSIS — R293 Abnormal posture: Secondary | ICD-10-CM | POA: Diagnosis not present

## 2022-12-31 DIAGNOSIS — Z1501 Genetic susceptibility to malignant neoplasm of breast: Secondary | ICD-10-CM

## 2022-12-31 NOTE — Therapy (Signed)
OUTPATIENT PHYSICAL THERAPY ONCOLOGY TREATMENT  Patient Name: Whitney Jenkins MRN: [id number] DOB:1976-03-11, 47 y.o., female Today's Date: 12/31/2022  END OF SESSION:  PT End of Session - 12/31/22 1003     Visit Number 7    Number of Visits 12    Date for PT Re-Evaluation 01/22/23    PT Start Time 1004    PT Stop Time 1053    PT Time Calculation (min) 49 min    Activity Tolerance Patient tolerated treatment well    Behavior During Therapy WFL for tasks assessed/performed               Past Medical History:  Diagnosis Date   Anemia    was on iron pills but states she has been off for a while   Chronic chest wall pain    Family history of breast cancer    Family history of breast cancer in female    Family history of pancreatic cancer    FH: BRCA2 gene positive    GERD (gastroesophageal reflux disease)    takes Omeprazole daily   History of migraine    last one 2wks ago   Leukopenia 12/11/2017   Ulcerative colitis    Followed by Dr. Collene Mares   Vitamin B12 deficiency 12/11/2017   Past Surgical History:  Procedure Laterality Date   CHOLECYSTECTOMY N/A 02/22/2014   Procedure: LAPAROSCOPIC CHOLECYSTECTOMY;  Surgeon: Harl Bowie, MD;  Location: [facility];  Service: General;  Laterality: N/A;   KNEE ARTHROSCOPY Left    LAPAROSCOPIC BILATERAL SALPINGO OOPHERECTOMY Bilateral 10/20/2018   Procedure: LAPAROSCOPIC BILATERAL SALPINGO OOPHORECTOMY;  Surgeon: Linda Hedges, DO;  Location: [facility];  Service: Gynecology;  Laterality: Bilateral;   MYOMECTOMY     TOTAL MASTECTOMY Bilateral 11/24/2022   Procedure: BILATERAL TOTAL MASTECTOMY;  Surgeon: Rolm Bookbinder, MD;  Location: [facility];  Service: General;  Laterality: Bilateral;   Patient Active Problem List   Diagnosis Date Noted   S/P bilateral mastectomy 11/24/2022   Costochondritis 08/31/2020   Bilateral shoulder pain 08/31/2020   Low back pain 08/31/2020   High risk medication use  08/31/2020   Sacroiliitis, not elsewhere classified 08/31/2020   Chronic chest wall pain 07/04/2020   History of bilateral oophorectomies 07/04/2020   Low TSH level 03/21/2020   Family history of thyroid disease in sister 03/21/2020   Leukopenia 12/11/2017   Vitamin B12 deficiency 12/11/2017   Ulcerative colitis, unspecified, without complications [id number]   Uterine fibroid 11/06/2015   Genetic testing 02/22/2015   BRCA2 positive 02/22/2015   FH: BRCA2 gene positive    Family history of breast cancer    Family history of breast cancer in female    Family history of pancreatic cancer    Chronic cholecystitis 02/17/2014      REFERRING PROVIDER: Rolm Bookbinder, MD  REFERRING DIAG: Bilateral Risk reducing mastectomies  THERAPY DIAG:  Abnormal posture  H/O bilateral mastectomy  Genetic susceptibility to malignant neoplasm of breast  BRCA2 positive  ONSET DATE: 11/22/2022  Rationale for Evaluation and Treatment: Rehabilitation  SUBJECTIVE:  SUBJECTIVE STATEMENT       I am doing well. The exercises she did last time helped to loosen up my pec on the left.Able to do all home activities  PERTINENT HISTORY:  Pt has family history of breast Cancer in her mom and sister with mutation that led her to do testing. She has had Salpingo-oophorectomy already. She is now s/p Bilateral  risk reducing Mastectomies on 11/24/2022 and does not wish to do reconstruction. Drains were removed on March 7,2024. She was given some exercises by MD.  PAIN:  Are you having pain? Nomore soreness than pain, Left greater than right chest,   PRECAUTIONS: None  WEIGHT BEARING RESTRICTIONS: No  FALLS:  Has patient fallen in last 6 months? No  LIVING ENVIRONMENT: Lives with: lives with their spouse Lives in:  House/apartment Stairs: Yes; External: 1 flight steps; on right going up Has following equipment at home: None  OCCUPATION: Medical illustrator  LEISURE: basketball, walk, watch TV,  HAND DOMINANCE: right   PRIOR LEVEL OF FUNCTION: Independent  PATIENT GOALS: Get back ROM/strength   OBJECTIVE:  COGNITION: Overall cognitive status: Within functional limits for tasks assessed   PALPATION: Tender globally bilateral chest and rib cage. Tightness in both pec groups   OBSERVATIONS / OTHER ASSESSMENTS: Mastectomy incisions covered with steri strips, mild swelling left greater than righ axillary regions/lateral trunk  SENSATION: Light touch: Deficits     POSTURE: forward head, round shoulders  UPPER EXTREMITY AROM/PROM:  A/PROM RIGHT   eval  RIGHT  12/23/2022  Shoulder extension 55 70  Shoulder flexion 127, tight at ribs from drains 163  Shoulder abduction 110  ribs 145  Shoulder internal rotation 62 55  Shoulder external rotation 84 90    (Blank rows = not tested)  A/PROM LEFT   eval LEFT 12/23/2022  Shoulder extension 42 78  Shoulder flexion 113 140  Shoulder abduction 80 121  Shoulder internal rotation  55  Shoulder external rotation  90    (Blank rows = not tested)  CERVICAL AROM: All within functional limits:      UPPER EXTREMITY STRENGTH: NT due to sx   LYMPHEDEMA ASSESSMENTS:   SURGERY TYPE/DATE: 11/24/2022, Bilateral risk reducing mastectomies for BRCA2 positive gene  NUMBER OF LYMPH NODES REMOVED: 2 breast LN's ?  CHEMOTHERAPY: No  RADIATION:NO  HORMONE TREATMENT: NO  INFECTIONS: NO  LYMPHEDEMA ASSESSMENTS:   LANDMARK RIGHT  eval  10 cm proximal to olecranon process   Olecranon process   10 cm proximal to ulnar styloid process   Just proximal to ulnar styloid process   Across hand at thumb web space   At base of 2nd digit   (Blank rows = not tested)  LANDMARK LEFT  eval  10 cm proximal to olecranon process   Olecranon process   10  cm proximal to ulnar styloid process   Just proximal to ulnar styloid process   Across hand at thumb web space   At base of 2nd digit   (Blank rows = not tested)      QUICK DASH SURVEY: 30%   TODAY'S TREATMENT:  DATE:   12/31/2022 Pulleys flexion and abduction x 2 min ea Ball rolls forward x 10, abduction x 5 ea Supine on half foam roll;Bilateral AROM flexion, scaption, horizontal abd x 5 for ROM and stabilization Supine on foam roll: supine scapular series x 10 ea with yellow band for ROM and stabilization LTR with arms outstretched and with goal post arms x 4 ea on mat table  12/29/2022: Swelling in left chest especially at lateral chest seems to be decreasing.  Started with gentle neck upper thoracic and lower trunk rotation. Added stretching to anterior body fascia with foam roller under sacrum and pt with leg extension and alternating knees to chest. Supine and side lying STM with coco butter to left pecs and lateral trunk. Instructed in supine scap series with yellow theraband x 5 reps with cues to keep core stabilized and dynamic stretching  3/28/204: Sitting scapular rolls, trunk twists and sidebending. Supine lower trunk rotation with feet aligned and wide  Pelvic tilts forward and back and laterally on mat and with purple ball under sacrum , bridges. Purple ball under thoracic spine for scapular retraction, shoulder ROM in all planes. Pt progressed to lying on half roller for more abd work and anterior abdominal stretching. Supine STM with coco butter to bilateral pecs and lateral trunk.  12/23/2022 Remeasured ROM Began with 1-2 reps of neck, scapular, shoulder, thoracic an lumbar spine ROM in sitting. Supine STM with coco butter to bilateral pecs and lateral trunk. Shoulder stretches in supine and in sidelying for shoulder flexion and abduction  with assist to stretch left scapula into more upward rotation to allow for more shoulder flexion. Continued with lower trunk rotation and pelvic tilts in addition to goal post arms in supine.  12/17/2022 Pulleys flexion 1 min, scaption and abd x 1:30 sec STM bilateral pectorals,UT, lateral trunk with cocoa butter Supine wand flexion, scaption x 5 P/ROM: In Supine to bilateral shoulder into flexion, abduction and D2 , and ER LTR bilaterally with arms extended x 5 ea 12/15/22: Manual Therapy MFR: To bil anterior chest walls and axilla during P/ROM P/ROM: In Supine to Lt shoulder into flexion, abduction and D2 to pts available end motions, then same to Rt but more time was spent on tighter Lt side  12/11/22: Reviewed 4 post op exercises and performed flexion and scaption with wand, supine star gazer, wall slide and scapular retraction x 5 ea. Will substitute wand in place of claped hands flexion.  PATIENT EDUCATION:  Education details: supine scapular series  Person educated: Patient Education method: Explanation, Demonstration, and Handouts Education comprehension: verbalized understanding and returned demonstration  HOME EXERCISE PROGRAM: Supine wand flex and scaption, post op exercises. Supine scapular series   ASSESSMENT:  CLINICAL IMPRESSION: Continues focus on core  and upper extremity stretching and reviewed strengthening with supine scap series today . Good improvement in ROM noted from beginning to end of session today. Left shoulder still limited slightly more than right. Pt very motivated and felt good after rx OBJECTIVE IMPAIRMENTS: decreased activity tolerance, decreased knowledge of condition, decreased mobility, decreased ROM, decreased strength, increased edema, impaired UE functional use, and postural dysfunction.   ACTIVITY LIMITATIONS: carrying, lifting, sleeping, and reach over head  PARTICIPATION LIMITATIONS: cleaning, occupation, and yard work  PERSONAL FACTORS: 1  comorbidity: BRCA2+  are also affecting patient's functional outcome.   REHAB POTENTIAL: Excellent  CLINICAL DECISION MAKING: Stable/uncomplicated  EVALUATION COMPLEXITY: Low  GOALS: Goals reviewed with patient? Yes  SHORT TERM GOALS= LONG TERM  GOALS: Target date: 01/22/2023  Pt will be independent in a HEP for ROM and strengthening Baseline: Goal status: MET 2.  Pt will have bilateral shoulder flexion and abduction atleast 150 degrees for improved reahcing Baseline:  Goal status: INITIAL  3.  Pts quick dash will be no greater than 10% Baseline:  Goal status: INITIAL  4.  Pt will resume normal light to medium household tasks without complaint Baseline:  Goal status: MET 5.  Pt will have improved sleep Baseline:  Goal status: MET  PLAN:  PT FREQUENCY: 2x/week  PT DURATION: 6 weeks  PLANNED INTERVENTIONS: Therapeutic exercises, Therapeutic activity, Neuromuscular re-education, Patient/Family education, Self Care, Joint mobilization, scar mobilization, Manual therapy, and Re-evaluation  PLAN FOR NEXT SESSION: add pec stretch. Cont ,STM to bilateral upper quarter, but focus on left pec  PROM , and add AROM in mulitplanar exercises as well as with pelvic tilts and lumbar rotation. Progress to lying on full foam roller assess incisions and start scar massage when ready, MFR techniques to rib area/chest/axilla. Has pulleys at home.    Encounter Date: 12/31/2022   Claris Pong, PT 12/31/2022, 10:54 AM  [facility] [address] [place], [place], [address] Phone: [phone]   Fax:  [facility] [address] [place], [place], [address] Phone: [phone]   Fax:  [phone]

## 2023-01-05 ENCOUNTER — Ambulatory Visit: Payer: No Typology Code available for payment source

## 2023-01-05 DIAGNOSIS — Z1501 Genetic susceptibility to malignant neoplasm of breast: Secondary | ICD-10-CM

## 2023-01-05 DIAGNOSIS — R293 Abnormal posture: Secondary | ICD-10-CM | POA: Diagnosis not present

## 2023-01-05 DIAGNOSIS — Z9013 Acquired absence of bilateral breasts and nipples: Secondary | ICD-10-CM

## 2023-01-05 DIAGNOSIS — Z1509 Genetic susceptibility to other malignant neoplasm: Secondary | ICD-10-CM

## 2023-01-05 NOTE — Therapy (Signed)
OUTPATIENT PHYSICAL THERAPY ONCOLOGY TREATMENT  Patient Name: Whitney Jenkins MRN: [id number] DOB:June 22, 1976, 47 y.o., female Today's Date: 01/05/2023  END OF SESSION:  PT End of Session - 01/05/23 1602     Visit Number 8    Number of Visits 12    Date for PT Re-Evaluation 01/22/23    PT Start Time 1604    PT Stop Time 1715    PT Time Calculation (min) 71 min    Activity Tolerance Patient tolerated treatment well    Behavior During Therapy WFL for tasks assessed/performed               Past Medical History:  Diagnosis Date   Anemia    was on iron pills but states she has been off for a while   Chronic chest wall pain    Family history of breast cancer    Family history of breast cancer in female    Family history of pancreatic cancer    FH: BRCA2 gene positive    GERD (gastroesophageal reflux disease)    takes Omeprazole daily   History of migraine    last one 2wks ago   Leukopenia 12/11/2017   Ulcerative colitis    Followed by Dr. Loreta AveMann   Vitamin B12 deficiency 12/11/2017   Past Surgical History:  Procedure Laterality Date   CHOLECYSTECTOMY N/A 02/22/2014   Procedure: LAPAROSCOPIC CHOLECYSTECTOMY;  Surgeon: Shelly Rubensteinouglas A Blackman, MD;  Location: [facility];  Service: General;  Laterality: N/A;   KNEE ARTHROSCOPY Left    LAPAROSCOPIC BILATERAL SALPINGO OOPHERECTOMY Bilateral 10/20/2018   Procedure: LAPAROSCOPIC BILATERAL SALPINGO OOPHORECTOMY;  Surgeon: Mitchel HonourMorris, Megan, DO;  Location: [facility];  Service: Gynecology;  Laterality: Bilateral;   MYOMECTOMY     TOTAL MASTECTOMY Bilateral 11/24/2022   Procedure: BILATERAL TOTAL MASTECTOMY;  Surgeon: Emelia LoronWakefield, Matthew, MD;  Location: [facility];  Service: General;  Laterality: Bilateral;   Patient Active Problem List   Diagnosis Date Noted   S/P bilateral mastectomy 11/24/2022   Costochondritis 08/31/2020   Bilateral shoulder pain 08/31/2020   Low back pain 08/31/2020   High risk medication use  08/31/2020   Sacroiliitis, not elsewhere classified 08/31/2020   Chronic chest wall pain 07/04/2020   History of bilateral oophorectomies 07/04/2020   Low TSH level 03/21/2020   Family history of thyroid disease in sister 03/21/2020   Leukopenia 12/11/2017   Vitamin B12 deficiency 12/11/2017   Ulcerative colitis, unspecified, without complications 12/10/2017   Uterine fibroid 11/06/2015   Genetic testing 02/22/2015   BRCA2 positive 02/22/2015   FH: BRCA2 gene positive    Family history of breast cancer    Family history of breast cancer in female    Family history of pancreatic cancer    Chronic cholecystitis 02/17/2014      REFERRING PROVIDER: Emelia LoronWakefield, Matthew, MD  REFERRING DIAG: Bilateral Risk reducing mastectomies  THERAPY DIAG:  Abnormal posture  H/O bilateral mastectomy  Genetic susceptibility to malignant neoplasm of breast  BRCA2 positive  ONSET DATE: 11/22/2022  Rationale for Evaluation and Treatment: Rehabilitation  SUBJECTIVE:  SUBJECTIVE STATEMENT       I am doing well. I went back to work today. Do I need to finish out all my visits?The left side is still a little tighter but overall doing well and keeping up with exercises.  PERTINENT HISTORY:  Pt has family history of breast Cancer in her mom and sister with mutation that led her to do testing. She has had Salpingo-oophorectomy already. She is now s/p Bilateral  risk reducing Mastectomies on 11/24/2022 and does not wish to do reconstruction. Drains were removed on March 7,2024. She was given some exercises by MD.  PAIN:  Are you having pain? Nomore soreness than pain, Left greater than right chest,   PRECAUTIONS: None  WEIGHT BEARING RESTRICTIONS: No  FALLS:  Has patient fallen in last 6 months? No  LIVING  ENVIRONMENT: Lives with: lives with their spouse Lives in: House/apartment Stairs: Yes; External: 1 flight steps; on right going up Has following equipment at home: None  OCCUPATION: Advertising account planner  LEISURE: basketball, walk, watch TV,  HAND DOMINANCE: right   PRIOR LEVEL OF FUNCTION: Independent  PATIENT GOALS: Get back ROM/strength   OBJECTIVE:  COGNITION: Overall cognitive status: Within functional limits for tasks assessed   PALPATION: Tender globally bilateral chest and rib cage. Tightness in both pec groups   OBSERVATIONS / OTHER ASSESSMENTS: Mastectomy incisions covered with steri strips, mild swelling left greater than righ axillary regions/lateral trunk  SENSATION: Light touch: Deficits     POSTURE: forward head, round shoulders  UPPER EXTREMITY AROM/PROM:  A/PROM RIGHT   eval  RIGHT  12/23/2022 RIGHT 01/05/2023  Shoulder extension 55 70   Shoulder flexion 127, tight at ribs from drains 163 167  Shoulder abduction 110  ribs 145 182  Shoulder internal rotation 62 55   Shoulder external rotation 84 90     (Blank rows = not tested)  A/PROM LEFT   eval LEFT 12/23/2022 LEFT 01/05/2023  Shoulder extension 42 78   Shoulder flexion 113 140 161  Shoulder abduction 80 121 170  Shoulder internal rotation  55   Shoulder external rotation  90     (Blank rows = not tested)  CERVICAL AROM: All within functional limits:      UPPER EXTREMITY STRENGTH: NT due to sx   LYMPHEDEMA ASSESSMENTS:   SURGERY TYPE/DATE: 11/24/2022, Bilateral risk reducing mastectomies for BRCA2 positive gene  NUMBER OF LYMPH NODES REMOVED: 2 breast LN's ?  CHEMOTHERAPY: No  RADIATION:NO  HORMONE TREATMENT: NO  INFECTIONS: NO     QUICK DASH SURVEY: 30%,   01/05/2023 :5%   TODAY'S TREATMENT:                                                                                                                                         DATE:   01/05/2023 Pulleys flexion and abduction  x 2 min ea Ball rolls forward x 10,  abduction x 5 ea  Supine on half foam roll;Bilateral AROM flexion, scaption, horizontal abd x 5 for ROM and stabilization Supine scapular Series x 5 ea with yellow on foam roll ABC stretches pg 1-12,  PROM left shoulder flexion, scaption, abduction Remeasured  bilateral Ue's and checked goals. Completed quick dash 12/31/2022 Pulleys flexion and abduction x 2 min ea Ball rolls forward x 10, abduction x 5 ea Supine on half foam roll;Bilateral AROM flexion, scaption, horizontal abd x 5 for ROM and stabilization Supine on foam roll: supine scapular series x 10 ea with yellow band for ROM and stabilization LTR with arms outstretched and with goal post arms x 4 ea on mat table  12/29/2022: Swelling in left chest especially at lateral chest seems to be decreasing.  Started with gentle neck upper thoracic and lower trunk rotation. Added stretching to anterior body fascia with foam roller under sacrum and pt with leg extension and alternating knees to chest. Supine and side lying STM with coco butter to left pecs and lateral trunk. Instructed in supine scap series with yellow theraband x 5 reps with cues to keep core stabilized and dynamic stretching  3/28/204: Sitting scapular rolls, trunk twists and sidebending. Supine lower trunk rotation with feet aligned and wide  Pelvic tilts forward and back and laterally on mat and with purple ball under sacrum , bridges. Purple ball under thoracic spine for scapular retraction, shoulder ROM in all planes. Pt progressed to lying on half roller for more abd work and anterior abdominal stretching. Supine STM with coco butter to bilateral pecs and lateral trunk.  12/23/2022 Remeasured ROM Began with 1-2 reps of neck, scapular, shoulder, thoracic an lumbar spine ROM in sitting. Supine STM with coco butter to bilateral pecs and lateral trunk. Shoulder stretches in supine and in sidelying for shoulder flexion and abduction with assist to  stretch left scapula into more upward rotation to allow for more shoulder flexion. Continued with lower trunk rotation and pelvic tilts in addition to goal post arms in supine.  12/17/2022 Pulleys flexion 1 min, scaption and abd x 1:30 sec STM bilateral pectorals,UT, lateral trunk with cocoa butter Supine wand flexion, scaption x 5 P/ROM: In Supine to bilateral shoulder into flexion, abduction and D2 , and ER LTR bilaterally with arms extended x 5 ea 12/15/22: Manual Therapy MFR: To bil anterior chest walls and axilla during P/ROM P/ROM: In Supine to Lt shoulder into flexion, abduction and D2 to pts available end motions, then same to Rt but more time was spent on tighter Lt side  12/11/22: Reviewed 4 post op exercises and performed flexion and scaption with wand, supine star gazer, wall slide and scapular retraction x 5 ea. Will substitute wand in place of claped hands flexion.  PATIENT EDUCATION:  Education details: supine scapular series  Person educated: Patient Education method: Explanation, Demonstration, and Handouts Education comprehension: verbalized understanding and returned demonstration  HOME EXERCISE PROGRAM: Supine wand flex and scaption, post op exercises. Supine scapular series , LTR with arms outstretched,  ASSESSMENT:  CLINICAL IMPRESSION: Pt has returned to work and will have difficulty attending therapy. Reviewed exercises and initiated ABC strength program. Continued stretching and assessed ROM and goals. Pt has achieved all goals established at evaluation. The left side is still a few degrees behind the right but is very functional. She was advised to continue to progress strength, but to remember to stretch daily until she no longer feels tightness. She is doing very well overall. She was  advised to contact us with any questions or concerns.  OBJECTIVE IMPAIRMENTS: decreased activity tolerance, decreased knowledge of condition, decreased mobility, decreased ROM,  decreased strength, increased edema, impaired UE functional use, and postural dysfunction.   ACTIVITY LIMITATIONS: carrying, lifting, sleeping, and reach over head  PARTICIPATION LIMITATIONS: cleaning, occupation, and yard work  PERSONAL FACTORS: 1 comorbidity: BRCA2+  are also affecting patient's functional outcome.   REHAB POTENTIAL: Excellent  CLINICAL DECISION MAKING: Stable/uncomplicated  EVALUATION COMPLEXITY: Low  GOALS: Goals reviewed with patient? Yes  SHORT TERM GOALS= LONG TERM GOALS: Target date: 01/22/2023  Pt will be independent in a HEP for ROM and strengthening Baseline: Goal status: MET 2.  Pt will have bilateral shoulder flexion and abduction atleast 150 degrees for improved reahcing Baseline:  Goal status:MET  01/05/2023  3.  Pts quick dash will be no greater than 10% Baseline:  Goal status: MET 01/05/2023 4.  Pt will resume normal light to medium household tasks without complaint Baseline:  Goal status: MET 5.  Pt will have improved sleep Baseline:  Goal status: MET  PLAN:  PT FREQUENCY: 2x/week  PT DURATION: 6 weeks  PLANNED INTERVENTIONS: Therapeutic exercises, Therapeutic activity, Neuromuscular re-education, Patient/Family education, Self Care, Joint mobilization, scar mobilization, Manual therapy, and Re-evaluation  PLAN FOR NEXT SESSION:  DC to HEP PHYSICAL THERAPY DISCHARGE SUMMARY  Visits from Start of Care: 8  Current functional level related to goals / functional outcomes: Pt has achieved all goals established   Remaining deficits: Mild ROM deficits on left   Education / Equipment: Yellow band, HEP for ROM and strength   Patient agrees to discharge. Patient goals were met. Patient is being discharged due to meeting the stated rehab goals.   Encounter Date: 01/05/2023   Waynette Buttery, PT 01/05/2023, 5:26 PM  [facility] [address] [place], [place], [address] Phone: [phone]    Fax:  [phone] [address] [place], [place], [address] Phone: [phone]   Fax:  [phone]
